# Patient Record
Sex: Female | Born: 1990 | Race: Black or African American | Hispanic: No | Marital: Single | Smoking: Current every day smoker
Health system: Southern US, Community
[De-identification: ages and names within clinical notes are randomized; demographics above are authoritative.]

## PROBLEM LIST (undated history)

## (undated) DIAGNOSIS — G039 Meningitis, unspecified: Secondary | ICD-10-CM

## (undated) DIAGNOSIS — G43909 Migraine, unspecified, not intractable, without status migrainosus: Secondary | ICD-10-CM

## (undated) DIAGNOSIS — D219 Benign neoplasm of connective and other soft tissue, unspecified: Secondary | ICD-10-CM

## (undated) DIAGNOSIS — Z87442 Personal history of urinary calculi: Secondary | ICD-10-CM

## (undated) DIAGNOSIS — D649 Anemia, unspecified: Secondary | ICD-10-CM

## (undated) HISTORY — PX: ROBOTIC ASSISTED LAPAROSCOPIC VAGINAL HYSTERECTOMY WITH FIBROID REMOVAL: SHX5387

## (undated) HISTORY — DX: Migraine, unspecified, not intractable, without status migrainosus: G43.909

## (undated) HISTORY — PX: OTHER SURGICAL HISTORY: SHX169

---
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
## 2010-01-29 ENCOUNTER — Emergency Department (HOSPITAL_COMMUNITY)
Admission: EM | Admit: 2010-01-29 | Discharge: 2010-01-29 | Payer: Self-pay | Source: Home / Self Care | Admitting: Emergency Medicine

## 2010-02-01 LAB — WET PREP, GENITAL
Trich, Wet Prep: NONE SEEN
Yeast Wet Prep HPF POC: NONE SEEN

## 2010-02-01 LAB — DIFFERENTIAL
Basophils Absolute: 0 10*3/uL (ref 0.0–0.1)
Basophils Relative: 0 % (ref 0–1)
Eosinophils Absolute: 0 10*3/uL (ref 0.0–0.7)
Eosinophils Relative: 0 % (ref 0–5)
Lymphocytes Relative: 34 % (ref 12–46)
Lymphs Abs: 2.1 10*3/uL (ref 0.7–4.0)
Monocytes Absolute: 0.4 10*3/uL (ref 0.1–1.0)
Monocytes Relative: 6 % (ref 3–12)
Neutro Abs: 3.7 10*3/uL (ref 1.7–7.7)
Neutrophils Relative %: 60 % (ref 43–77)

## 2010-02-01 LAB — LIPASE, BLOOD: Lipase: 29 U/L (ref 11–59)

## 2010-02-01 LAB — URINE MICROSCOPIC-ADD ON

## 2010-02-01 LAB — URINALYSIS, ROUTINE W REFLEX MICROSCOPIC
Bilirubin Urine: NEGATIVE
Hgb urine dipstick: NEGATIVE
Ketones, ur: NEGATIVE mg/dL
Nitrite: NEGATIVE
Protein, ur: NEGATIVE mg/dL
Specific Gravity, Urine: 1.024 (ref 1.005–1.030)
Urine Glucose, Fasting: NEGATIVE mg/dL
Urobilinogen, UA: 1 mg/dL (ref 0.0–1.0)
pH: 6.5 (ref 5.0–8.0)

## 2010-02-01 LAB — COMPREHENSIVE METABOLIC PANEL
ALT: 17 U/L (ref 0–35)
AST: 17 U/L (ref 0–37)
Albumin: 4.1 g/dL (ref 3.5–5.2)
Alkaline Phosphatase: 48 U/L (ref 39–117)
BUN: 5 mg/dL — ABNORMAL LOW (ref 6–23)
CO2: 24 mEq/L (ref 19–32)
Calcium: 9.2 mg/dL (ref 8.4–10.5)
Chloride: 106 mEq/L (ref 96–112)
Creatinine, Ser: 0.68 mg/dL (ref 0.4–1.2)
GFR calc Af Amer: >60 mL/min (ref >60)
GFR calc non Af Amer: >60 mL/min (ref >60)
Glucose, Bld: 95 mg/dL (ref 70–99)
Potassium: 3.8 mEq/L (ref 3.5–5.1)
Sodium: 139 mEq/L (ref 135–145)
Total Bilirubin: 0.6 mg/dL (ref 0.3–1.2)
Total Protein: 8.5 g/dL — ABNORMAL HIGH (ref 6.0–8.3)

## 2010-02-01 LAB — CBC
HCT: 32.7 % — ABNORMAL LOW (ref 36.0–46.0)
Hemoglobin: 10.2 g/dL — ABNORMAL LOW (ref 12.0–15.0)
MCH: 20.8 pg — ABNORMAL LOW (ref 26.0–34.0)
MCHC: 31.2 g/dL (ref 30.0–36.0)
MCV: 66.6 fL — ABNORMAL LOW (ref 78.0–100.0)
Platelets: 418 10*3/uL — ABNORMAL HIGH (ref 150–400)
RBC: 4.91 MIL/uL (ref 3.87–5.11)
RDW: 18.9 % — ABNORMAL HIGH (ref 11.5–15.5)
WBC: 6.2 10*3/uL (ref 4.0–10.5)

## 2010-02-01 LAB — OCCULT BLOOD, POC DEVICE: Fecal Occult Bld: NEGATIVE

## 2010-02-01 LAB — GC/CHLAMYDIA PROBE AMP, GENITAL
Chlamydia, DNA Probe: NEGATIVE
GC Probe Amp, Genital: NEGATIVE

## 2010-02-01 LAB — POCT PREGNANCY, URINE: Preg Test, Ur: NEGATIVE

## 2010-03-31 LAB — COMPREHENSIVE METABOLIC PANEL
ALT: 19 U/L (ref 0–35)
AST: 21 U/L (ref 0–37)
Albumin: 3.8 g/dL (ref 3.5–5.2)
Alkaline Phosphatase: 45 U/L (ref 39–117)
BUN: 8 mg/dL (ref 6–23)
CO2: 23 mEq/L (ref 19–32)
Calcium: 9.1 mg/dL (ref 8.4–10.5)
Chloride: 107 mEq/L (ref 96–112)
Creatinine, Ser: 0.72 mg/dL (ref 0.4–1.2)
GFR calc Af Amer: >60 mL/min (ref >60)
GFR calc non Af Amer: >60 mL/min (ref >60)
Glucose, Bld: 103 mg/dL — ABNORMAL HIGH (ref 70–99)
Potassium: 3.4 mEq/L — ABNORMAL LOW (ref 3.5–5.1)
Sodium: 138 mEq/L (ref 135–145)
Total Bilirubin: 0.4 mg/dL (ref 0.3–1.2)
Total Protein: 7.4 g/dL (ref 6.0–8.3)

## 2010-03-31 LAB — LIPASE, BLOOD: Lipase: 39 U/L (ref 11–59)

## 2010-03-31 LAB — CBC
HCT: 31.2 % — ABNORMAL LOW (ref 36.0–46.0)
Hemoglobin: 10 g/dL — ABNORMAL LOW (ref 12.0–15.0)
MCH: 21 pg — ABNORMAL LOW (ref 26.0–34.0)
MCHC: 32 g/dL (ref 30.0–36.0)
MCV: 65.7 fL — ABNORMAL LOW (ref 78.0–100.0)
Platelets: 369 10*3/uL (ref 150–400)
RBC: 4.75 MIL/uL (ref 3.87–5.11)
RDW: 20.4 % — ABNORMAL HIGH (ref 11.5–15.5)
WBC: 6.3 10*3/uL (ref 4.0–10.5)

## 2010-03-31 LAB — URINE MICROSCOPIC-ADD ON

## 2010-03-31 LAB — DIFFERENTIAL
Basophils Absolute: 0 10*3/uL (ref 0.0–0.1)
Basophils Relative: 0 % (ref 0–1)
Eosinophils Absolute: 0.1 10*3/uL (ref 0.0–0.7)
Eosinophils Relative: 1 % (ref 0–5)
Lymphocytes Relative: 39 % (ref 12–46)
Lymphs Abs: 2.5 10*3/uL (ref 0.7–4.0)
Monocytes Absolute: 0.5 10*3/uL (ref 0.1–1.0)
Monocytes Relative: 8 % (ref 3–12)
Neutro Abs: 3.2 10*3/uL (ref 1.7–7.7)
Neutrophils Relative %: 52 % (ref 43–77)

## 2010-03-31 LAB — URINALYSIS, ROUTINE W REFLEX MICROSCOPIC
Bilirubin Urine: NEGATIVE
Glucose, UA: NEGATIVE mg/dL
Hgb urine dipstick: NEGATIVE
Ketones, ur: 15 mg/dL — AB
Nitrite: NEGATIVE
Protein, ur: NEGATIVE mg/dL
Specific Gravity, Urine: 1.03 (ref 1.005–1.030)
Urobilinogen, UA: 0.2 mg/dL (ref 0.0–1.0)
pH: 5.5 (ref 5.0–8.0)

## 2010-03-31 LAB — PREGNANCY, URINE: Preg Test, Ur: NEGATIVE

## 2011-03-24 ENCOUNTER — Encounter (HOSPITAL_COMMUNITY): Payer: Self-pay | Admitting: Emergency Medicine

## 2011-03-24 ENCOUNTER — Emergency Department (HOSPITAL_COMMUNITY)
Admission: EM | Admit: 2011-03-24 | Discharge: 2011-03-24 | Disposition: A | Discharge status: Home / Self Care | Payer: Self-pay | Attending: Emergency Medicine | Admitting: Emergency Medicine

## 2011-03-24 DIAGNOSIS — F17200 Nicotine dependence, unspecified, uncomplicated: Secondary | ICD-10-CM | POA: Insufficient documentation

## 2011-03-24 DIAGNOSIS — R509 Fever, unspecified: Secondary | ICD-10-CM | POA: Insufficient documentation

## 2011-03-24 DIAGNOSIS — J029 Acute pharyngitis, unspecified: Secondary | ICD-10-CM | POA: Insufficient documentation

## 2011-03-24 DIAGNOSIS — H9209 Otalgia, unspecified ear: Secondary | ICD-10-CM | POA: Insufficient documentation

## 2011-03-24 LAB — RAPID STREP SCREEN (MED CTR MEBANE ONLY): Streptococcus, Group A Screen (Direct): NEGATIVE

## 2011-03-24 MED ORDER — AMOXICILLIN 500 MG PO CAPS: 500.0000 mg | ORAL_CAPSULE | Freq: Three times a day (TID) | ORAL | Status: DC

## 2011-03-24 MED ORDER — OXYCODONE-ACETAMINOPHEN 5-325 MG PO TABS
1.0000 | ORAL_TABLET | Freq: Once | ORAL | Status: AC
  Administered 2011-03-24: 1 via ORAL
  Filled 2011-03-24: qty 1

## 2011-03-24 MED ORDER — ANTIPYRINE-BENZOCAINE 5.4-1.4 % OT SOLN: 3.0000 [drp] | OTIC | Status: DC | PRN

## 2011-03-24 NOTE — ED Notes (Signed)
Pt refused ear drops.

## 2011-03-24 NOTE — ED Notes (Signed)
Ordered Auralgan from pharmacy.

## 2011-03-24 NOTE — ED Provider Notes (Signed)
History     CSN: [id number]  Arrival date & time 03/24/11  2139   First MD Initiated Contact with Patient 03/24/11 2159      Chief Complaint  Patient presents with  . Sore Throat    (Consider location/radiation/quality/duration/timing/severity/associated sxs/prior treatment) Patient is a 21 y.o. female presenting with pharyngitis. The history is provided by the patient. No language interpreter was used.  Sore Throat This is a new problem. The current episode started in the past 7 days. The problem occurs constantly. The problem has been gradually worsening. Associated symptoms include congestion and a fever. Pertinent negatives include no chills. The symptoms are aggravated by swallowing. She has tried acetaminophen for the symptoms. The treatment provided mild relief.    History reviewed. No pertinent past medical history.  History reviewed. No pertinent past surgical history.  No family history on file.  History  Substance Use Topics  . Smoking status: Current Everyday Smoker  . Smokeless tobacco: Not on file  . Alcohol Use: Yes    OB History    Grav Para Term Preterm Abortions TAB SAB Ect Mult Living                  Review of Systems  Constitutional: Positive for fever. Negative for chills.  HENT: Positive for congestion.   All other systems reviewed and are negative.    Allergies  Vancomycin  Home Medications   Current Outpatient Rx  Name Route Sig Dispense Refill  . ACETAMINOPHEN-GUAIFENESIN 325-200 MG PO TABS Oral Take 1 tablet by mouth every 6 (six) hours as needed. For cold symptoms    . IBUPROFEN 200 MG PO TABS Oral Take 200 mg by mouth every 6 (six) hours as needed. For pain/fever    . PHENOL 1.4 % MT LIQD Mouth/Throat Use as directed 1 spray in the mouth or throat as needed. For sore throat      BP 151/99  Pulse 82  Temp(Src) 98.2 F (36.8 C) (Oral)  Resp 18  SpO2 100%  LMP 03/22/2011  Physical Exam  Nursing note and vitals  reviewed. Constitutional: She is oriented to person, place, and time. She appears well-developed and well-nourished.  HENT:  Head: Normocephalic and atraumatic.  Right Ear: Tympanic membrane is injected and bulging. A middle ear effusion is present.  Mouth/Throat: Posterior oropharyngeal erythema present.  Eyes: Conjunctivae are normal. Pupils are equal, round, and reactive to light.  Neck: Normal range of motion. Neck supple.  Cardiovascular: Normal rate, regular rhythm, normal heart sounds and intact distal pulses.   Pulmonary/Chest: Effort normal and breath sounds normal.  Abdominal: Soft. Bowel sounds are normal.  Musculoskeletal: Normal range of motion.  Lymphadenopathy:    She has no cervical adenopathy.  Neurological: She is alert and oriented to person, place, and time. She has normal reflexes.  Skin: Skin is warm and dry.  Psychiatric: She has a normal mood and affect. Her behavior is normal. Judgment and thought content normal.    ED Course  Procedures (including critical care time)   Labs Reviewed  RAPID STREP SCREEN   No results found.   No diagnosis found.  Strep screen negative.  Otalgia likely due to OME.  MDM          Jimmye Norman, NP 03/24/11 2337

## 2011-03-24 NOTE — Discharge Instructions (Signed)
Otalgia The most common reason for this in children is an infection of the middle ear. Pain from the middle ear is usually caused by a build-up of fluid and pressure behind the eardrum. Pain from an earache can be sharp, dull, or burning. The pain may be temporary or constant. The middle ear is connected to the nasal passages by a short narrow tube called the Eustachian tube. The Eustachian tube allows fluid to drain out of the middle ear, and helps keep the pressure in your ear equalized. CAUSES  A cold or allergy can block the Eustachian tube with inflammation and the build-up of secretions. This is especially likely in small children, because their Eustachian tube is shorter and more horizontal. When the Eustachian tube closes, the normal flow of fluid from the middle ear is stopped. Fluid can accumulate and cause stuffiness, pain, hearing loss, and an ear infection if germs start growing in this area. SYMPTOMS  The symptoms of an ear infection may include fever, ear pain, fussiness, increased crying, and irritability. Many children will have temporary and minor hearing loss during and right after an ear infection. Permanent hearing loss is rare, but the risk increases the more infections a child has. Other causes of ear pain include retained water in the outer ear canal from swimming and bathing. Ear pain in adults is less likely to be from an ear infection. Ear pain may be referred from other locations. Referred pain may be from the joint between your jaw and the skull. It may also come from a tooth problem or problems in the neck. Other causes of ear pain include:  A foreign body in the ear.   Outer ear infection.   Sinus infections.   Impacted ear wax.   Ear injury.   Arthritis of the jaw or TMJ problems.   Middle ear infection.   Tooth infections.   Sore throat with pain to the ears.  DIAGNOSIS  Your caregiver can usually make the diagnosis by examining you. Sometimes other special  studies, including x-rays and lab work may be necessary. TREATMENT   If antibiotics were prescribed, use them as directed and finish them even if you or your child's symptoms seem to be improved.   Sometimes PE tubes are needed in children. These are little plastic tubes which are put into the eardrum during a simple surgical procedure. They allow fluid to drain easier and allow the pressure in the middle ear to equalize. This helps relieve the ear pain caused by pressure changes.  HOME CARE INSTRUCTIONS   Only take over-the-counter or prescription medicines for pain, discomfort, or fever as directed by your caregiver. DO NOT GIVE CHILDREN ASPIRIN because of the association of Reye's Syndrome in children taking aspirin.   Use a cold pack applied to the outer ear for 15 to 20 minutes, 3 to 4 times per day or as needed may reduce pain. Do not apply ice directly to the skin. You may cause frost bite.   Over-the-counter ear drops used as directed may be effective. Your caregiver may sometimes prescribe ear drops.   Resting in an upright position may help reduce pressure in the middle ear and relieve pain.   Ear pain caused by rapidly descending from high altitudes can be relieved by swallowing or chewing gum. Allowing infants to suck on a bottle during airplane travel can help.   Do not smoke in the house or near children. If you are unable to quit smoking, smoke outside.     Control allergies.  SEEK IMMEDIATE MEDICAL CARE IF:   You or your child are becoming sicker.   Pain or fever relief is not obtained with medicine.   You or your child's symptoms (pain, fever, or irritability) do not improve within 24 to 48 hours or as instructed.   Severe pain suddenly stops hurting. This may indicate a ruptured eardrum.   You or your children develop new problems such as severe headaches, stiff neck, difficulty swallowing, or swelling of the face or around the ear.  Document Released: 08/21/2003  Document Revised: 12/23/2010 Document Reviewed: 12/26/2007 [facility] Patient Information 2012 [place], [place].  RESOURCE GUIDE  Dental Problems  Patients with Medicaid: [facility] [phone]                                           [phone] [address] [employer] Phone:  [phone]                                                  Phone:  [phone]  If unable to pay or uninsured, contact:  [facility] or [facility]. to become qualified for the adult dental clinic.  Chronic Pain Problems Contact [facility]  [phone] Patients need to be referred by their primary care doctor.  Insufficient Money for Medicine Contact United Way:  call "211" or [facility] ([phone])404-9750.  No Primary Care Doctor Call [facility]  407-714-6464 Other agencies that provide inexpensive medical care    [facility]  [phone]    [facility]  [phone]    [facility]  [phone]    [facility]  [phone]    [facility]  [phone]    [facility]  [phone]  Psychological Services [facility]  [phone] [facility] Services  [phone] [facility]   [phone] (emergency services [phone])  Substance Abuse Resources [facility]  539-784-8435 [facility] [phone]) [phone] [facility] [phone] Floydene Flock 667-880-8772 Residential & Outpatient Substance Abuse Program  ([phone]  Abuse/Neglect [facility] Child Abuse Hotline 715-692-9726 [facility] Child Abuse Hotline 617-395-4566 (After Hours)  Emergency Shelter [facility] 6295259614  Maternity Homes Room at the [place] [facility] 213-370-5080 [facility] [phone]  MRSA Hotline #:   (418)880-2855    [facility] Resources  [facility]     [employer]                           [facility] Dept. [address] [place]                       [address]      [address]  [address]  Cristobal Goldmann Phone:  [phone]                                   Phone:  [phone]                 Phone:  [phone]  [facility] Mental Health Phone:  [phone]  [facility] Child Abuse Hotline (816)629-1336 [phone])[phone] (After Hours)

## 2011-03-24 NOTE — ED Notes (Signed)
PT. REPORTS SORE THROAT FOR 3 DAYS WITH NASAL CONGESTION , HEADACHE AND RIGHT EAR ACHE. DENIES FEVER OR CHILLS.

## 2011-03-26 NOTE — ED Provider Notes (Signed)
Medical screening examination/treatment/procedure(s) were performed by non-physician practitioner and as supervising physician I was immediately available for consultation/collaboration.  Cyndra Numbers, MD 03/26/11 [phone]

## 2011-03-29 ENCOUNTER — Encounter (HOSPITAL_COMMUNITY): Payer: Self-pay | Admitting: Emergency Medicine

## 2011-03-29 ENCOUNTER — Emergency Department (INDEPENDENT_AMBULATORY_CARE_PROVIDER_SITE_OTHER)
Admission: EM | Admit: 2011-03-29 | Discharge: 2011-03-29 | Disposition: A | Discharge status: Home / Self Care | Payer: Self-pay | Source: Home / Self Care | Attending: Family Medicine | Admitting: Family Medicine

## 2011-03-29 DIAGNOSIS — J029 Acute pharyngitis, unspecified: Secondary | ICD-10-CM

## 2011-03-29 DIAGNOSIS — J329 Chronic sinusitis, unspecified: Secondary | ICD-10-CM

## 2011-03-29 MED ORDER — IBUPROFEN 600 MG PO TABS: 600.0000 mg | ORAL_TABLET | Freq: Three times a day (TID) | ORAL | Status: AC | PRN

## 2011-03-29 MED ORDER — OMEPRAZOLE 20 MG PO CPDR: 20.0000 mg | DELAYED_RELEASE_CAPSULE | Freq: Every day | ORAL | Status: DC

## 2011-03-29 MED ORDER — PENICILLIN V POTASSIUM 500 MG PO TABS: 500.0000 mg | ORAL_TABLET | Freq: Three times a day (TID) | ORAL | Status: AC

## 2011-03-29 MED ORDER — IBUPROFEN 800 MG PO TABS
ORAL_TABLET | ORAL | Status: AC
  Filled 2011-03-29: qty 1

## 2011-03-29 MED ORDER — IBUPROFEN 800 MG PO TABS
800.0000 mg | ORAL_TABLET | Freq: Once | ORAL | Status: AC
  Administered 2011-03-29: 800 mg via ORAL

## 2011-03-29 MED ORDER — FEXOFENADINE-PSEUDOEPHED ER 60-120 MG PO TB12: 1.0000 | ORAL_TABLET | Freq: Two times a day (BID) | ORAL | Status: DC

## 2011-03-29 MED ORDER — PREDNISONE 20 MG PO TABS: ORAL_TABLET | ORAL | Status: AC

## 2011-03-29 NOTE — ED Provider Notes (Signed)
History     CSN: [id number]  Arrival date & time 03/29/11  1510   First MD Initiated Contact with Patient 03/29/11 1613      Chief Complaint  Patient presents with  . Sore Throat    (Consider location/radiation/quality/duration/timing/severity/associated sxs/prior treatment) HPI Comments: 21 year old female with history of obesity otherwise previously healthy. Here complaining of sore throat sinus pressure in right ear ache for 8 days. Has felt chills and subjective fever. Denies cough or chest pain. Reports headache and nausea but denies vomiting. Has been taking acetaminophen for 3 times a day without significant relief. Was seen in the emergency department 5 days ago had a negative rapid strep test that she had been e-prescribed amoxicillin and ibuprofen. Apparently patient never filled her prescriptions and was not aware of them. She is able to eat solids and drink fluids with mild discomfort. Denies shortness of breath or difficulty breathing. Reports epigastric type of discomfort and decreased appetite. No diarrhea or rash.   History reviewed. No pertinent past medical history.  History reviewed. No pertinent past surgical history.  No family history on file.  History  Substance Use Topics  . Smoking status: Current Everyday Smoker  . Smokeless tobacco: Not on file  . Alcohol Use: Yes    OB History    Grav Para Term Preterm Abortions TAB SAB Ect Mult Living                  Review of Systems  Constitutional: Positive for chills and appetite change.  HENT: Positive for congestion, sore throat, rhinorrhea, trouble swallowing and sinus pressure. Negative for facial swelling, neck pain and voice change.   Respiratory: Negative for cough, shortness of breath and wheezing.   Gastrointestinal: Positive for nausea. Negative for vomiting, abdominal pain and diarrhea.  Skin: Negative for rash.  Neurological: Positive for headaches.    Allergies  Vancomycin  Home  Medications   Current Outpatient Rx  Name Route Sig Dispense Refill  . ACETAMINOPHEN 500 MG PO TABS Oral Take 500 mg by mouth every 6 (six) hours as needed.    Marland Kitchen FEXOFENADINE-PSEUDOEPHED ER 60-120 MG PO TB12 Oral Take 1 tablet by mouth every 12 (twelve) hours. 30 tablet 0  . IBUPROFEN 600 MG PO TABS Oral Take 1 tablet (600 mg total) by mouth every 8 (eight) hours as needed for pain or fever. 30 tablet 0  . OMEPRAZOLE 20 MG PO CPDR Oral Take 1 capsule (20 mg total) by mouth daily. 30 capsule 0  . PENICILLIN V POTASSIUM 500 MG PO TABS Oral Take 1 tablet (500 mg total) by mouth 3 (three) times daily. 30 tablet 0  . PREDNISONE 20 MG PO TABS  2 tabs po daily for 5 days and will 10 tablet no    BP 142/100  Pulse 90  Temp(Src) 98.5 F (36.9 C) (Oral)  Resp 20  SpO2 98%  LMP 03/21/2011  Physical Exam  Nursing note and vitals reviewed. Constitutional: She is oriented to person, place, and time. She appears well-developed and well-nourished. No distress.  HENT:  Head: Normocephalic and atraumatic.  Right Ear: External ear normal.  Left Ear: External ear normal.  Mouth/Throat: Oropharynx is clear and moist. No oropharyngeal exudate.       Nasal Congestion with erythema and swelling of nasal turbinates, clear rhinorrhea. Reported sinus tenderness with palpation. motherate pharyngeal erythema no exudates, postnasal drip. No uvula deviation. No trismus. TM's with increased vascular markings and some dullness bilaterally no swelling or bulging  Eyes: Conjunctivae are normal. Pupils are equal, round, and reactive to light.  Neck: Normal range of motion. Neck supple. No thyromegaly present.  Abdominal: Soft. There is no tenderness.  Lymphadenopathy:    She has no cervical adenopathy.  Neurological: She is alert and oriented to person, place, and time.  Skin: No rash noted.    ED Course  Procedures (including critical care time)  Labs Reviewed - No data to display No results found.   1.  Sinusitis   2. Sore throat       MDM  Treated with penicillin and prednisone, ibuprofen and decongestant. Given paper prescription.        Sharin Grave, MD 03/31/11 1234

## 2011-03-29 NOTE — Discharge Instructions (Signed)
  Is very important top keep well hydrated. Take the prescribed medications as instructed. Take ibuprofen scheduled for the next 24-48 hours take with food and plenty of liquids as it can upset your stomach, can take over the counter prilosec while taking ibuprofen. Use nasal saline spray at least 3 times a day. (simply saline is over the counter) Return if worsening or persistent symptoms despite following treatrment.

## 2011-03-29 NOTE — ED Notes (Signed)
Sore throat onset 03/21/2011.  Right ear stuffy since Thursday, intermittent headache, stuffy nose, reports intermittent cough.  Intermittent nausea, no vomiting, no diarrhea. Denies fever.

## 2011-12-24 ENCOUNTER — Emergency Department (HOSPITAL_COMMUNITY)
Admission: EM | Admit: 2011-12-24 | Discharge: 2011-12-24 | Disposition: A | Discharge status: Home / Self Care | Payer: Self-pay | Attending: Emergency Medicine | Admitting: Emergency Medicine

## 2011-12-24 ENCOUNTER — Encounter (HOSPITAL_COMMUNITY): Payer: Self-pay | Admitting: Emergency Medicine

## 2011-12-24 DIAGNOSIS — K011 Impacted teeth: Secondary | ICD-10-CM

## 2011-12-24 DIAGNOSIS — F17200 Nicotine dependence, unspecified, uncomplicated: Secondary | ICD-10-CM | POA: Insufficient documentation

## 2011-12-24 DIAGNOSIS — K047 Periapical abscess without sinus: Secondary | ICD-10-CM | POA: Insufficient documentation

## 2011-12-24 DIAGNOSIS — K006 Disturbances in tooth eruption: Secondary | ICD-10-CM | POA: Insufficient documentation

## 2011-12-24 MED ORDER — OXYCODONE-ACETAMINOPHEN 5-325 MG PO TABS: 1.0000 | ORAL_TABLET | Freq: Four times a day (QID) | ORAL | Status: DC | PRN

## 2011-12-24 MED ORDER — PENICILLIN V POTASSIUM 250 MG PO TABS
500.0000 mg | ORAL_TABLET | Freq: Once | ORAL | Status: AC
  Administered 2011-12-24: 500 mg via ORAL
  Filled 2011-12-24: qty 2

## 2011-12-24 MED ORDER — PENICILLIN V POTASSIUM 500 MG PO TABS: 500.0000 mg | ORAL_TABLET | Freq: Three times a day (TID) | ORAL | Status: DC

## 2011-12-24 MED ORDER — IBUPROFEN 400 MG PO TABS
800.0000 mg | ORAL_TABLET | Freq: Once | ORAL | Status: AC
  Administered 2011-12-24: 800 mg via ORAL
  Filled 2011-12-24: qty 2

## 2011-12-24 NOTE — ED Notes (Signed)
Pt c/o pain in left lower back tooth x's 2 days.  St's has been hurting off and on x's 2 yrs.

## 2011-12-24 NOTE — ED Provider Notes (Signed)
History     CSN: [id number]  Arrival date & time 12/24/11  0006   First MD Initiated Contact with Patient 12/24/11 0040      Chief Complaint  Patient presents with  . Dental Pain   HPI  History provided by the patient. Patient is a 21 year old female with no significant PMH who presents with complaints of worsened left lower molar pain for the past 2 days. Patient reports having problems with all 4 of her wisdom teeth over the past 2 years. She states she has waxing and waning pains from time to time usually manageable at home with over-the-counter pain medications. Over the past 2 days pain has been significantly worse and more persistent. Patient states she has been unable to sleep well. Pain is worse with any chewing or pressure over the area. She denies any other aggravating or alleviating factors. She denies any other associated symptoms. Denies any fever, chills, sweats.     History reviewed. No pertinent past medical history.  History reviewed. No pertinent past surgical history.  No family history on file.  History  Substance Use Topics  . Smoking status: Current Every Day Smoker  . Smokeless tobacco: Not on file  . Alcohol Use: Yes    OB History    Grav Para Term Preterm Abortions TAB SAB Ect Mult Living                  Review of Systems  Constitutional: Negative for fever, chills and diaphoresis.  HENT: Positive for dental problem. Negative for sore throat, drooling and trouble swallowing.   Gastrointestinal: Negative for nausea and vomiting.  All other systems reviewed and are negative.    Allergies  Vancomycin  Home Medications   Current Outpatient Rx  Name  Route  Sig  Dispense  Refill  . ACETAMINOPHEN 500 MG PO TABS   Oral   Take 500 mg by mouth every 6 (six) hours as needed. For pain         . IBUPROFEN 200 MG PO TABS   Oral   Take 200 mg by mouth every 6 (six) hours as needed. For pain           BP 136/84  Pulse 94  Temp 98.3 F  (36.8 C) (Oral)  Resp 16  SpO2 100%  LMP 11/29/2011  Physical Exam  Nursing note and vitals reviewed. Constitutional: She is oriented to person, place, and time. She appears well-developed and well-nourished. No distress.  HENT:  Head: Normocephalic.  Mouth/Throat: Oropharynx is clear and moist.         Patient with partially impacted left lower third molar. There is inflammation and slight friability of the lateral adjacent common tissue. Patient is tender in this area. No significant facial swelling.  Neck: Normal range of motion. Neck supple.  Cardiovascular: Normal rate and regular rhythm.   No murmur heard. Pulmonary/Chest: Effort normal and breath sounds normal. No respiratory distress. She has no wheezes. She has no rales.  Lymphadenopathy:    She has no cervical adenopathy.  Neurological: She is alert and oriented to person, place, and time.  Skin: Skin is warm and dry.  Psychiatric: She has a normal mood and affect. Her behavior is normal.    ED Course  Procedures      1. Impacted third molar tooth   2. Periapical abscess       MDM  Patient seen and evaluated. Patient appears comfortable in no acute distress.  Patient given  prescriptions for pain medications and antibiotic. Patient also given dental and oral surgery referral.      Angus Seller, PA 12/24/11 [phone]

## 2011-12-24 NOTE — ED Notes (Signed)
Pt st's she has had pain in left lower back tooth off and on x's 2 yrs.  St's pain started again 2 days ago and has not stopped

## 2011-12-26 NOTE — ED Provider Notes (Signed)
Medical screening examination/treatment/procedure(s) were performed by non-physician practitioner and as supervising physician I was immediately available for consultation/collaboration.  Donnetta Hutching, MD 12/26/11 1520

## 2014-04-20 ENCOUNTER — Emergency Department (HOSPITAL_COMMUNITY)
Admission: EM | Admit: 2014-04-20 | Discharge: 2014-04-20 | Disposition: A | Discharge status: Home / Self Care | Payer: Self-pay | Attending: Emergency Medicine | Admitting: Emergency Medicine

## 2014-04-20 ENCOUNTER — Encounter (HOSPITAL_COMMUNITY): Payer: Self-pay | Admitting: Emergency Medicine

## 2014-04-20 DIAGNOSIS — N390 Urinary tract infection, site not specified: Secondary | ICD-10-CM | POA: Insufficient documentation

## 2014-04-20 DIAGNOSIS — Z79899 Other long term (current) drug therapy: Secondary | ICD-10-CM | POA: Insufficient documentation

## 2014-04-20 DIAGNOSIS — Z3202 Encounter for pregnancy test, result negative: Secondary | ICD-10-CM | POA: Insufficient documentation

## 2014-04-20 DIAGNOSIS — Z792 Long term (current) use of antibiotics: Secondary | ICD-10-CM | POA: Insufficient documentation

## 2014-04-20 DIAGNOSIS — Z720 Tobacco use: Secondary | ICD-10-CM | POA: Insufficient documentation

## 2014-04-20 LAB — URINE MICROSCOPIC-ADD ON

## 2014-04-20 LAB — WET PREP, GENITAL
CLUE CELLS WET PREP: NONE SEEN
TRICH WET PREP: NONE SEEN
YEAST WET PREP: NONE SEEN

## 2014-04-20 LAB — URINALYSIS, ROUTINE W REFLEX MICROSCOPIC
BILIRUBIN URINE: NEGATIVE
GLUCOSE, UA: NEGATIVE mg/dL
KETONES UR: NEGATIVE mg/dL
Nitrite: NEGATIVE
PH: 5.5 (ref 5.0–8.0)
PROTEIN: 30 mg/dL — AB
SPECIFIC GRAVITY, URINE: 1.022 (ref 1.005–1.030)
UROBILINOGEN UA: 0.2 mg/dL (ref 0.0–1.0)

## 2014-04-20 LAB — PREGNANCY, URINE: PREG TEST UR: NEGATIVE

## 2014-04-20 MED ORDER — CEPHALEXIN 500 MG PO CAPS: 500.0000 mg | ORAL_CAPSULE | Freq: Four times a day (QID) | ORAL | Status: DC

## 2014-04-20 MED ORDER — PHENAZOPYRIDINE HCL 95 MG PO TABS: 95.0000 mg | ORAL_TABLET | Freq: Three times a day (TID) | ORAL | Status: DC | PRN

## 2014-04-20 NOTE — ED Provider Notes (Signed)
CSN: [id number]     Arrival date & time 04/20/14  1205 History  This chart was scribed for a non-physician practitioner, Comer Locket, PA-C working with Evelina Bucy, MD by Martinique Peace, ED Scribe. The patient was seen in [facility]. The patient's care was started at 12:17 PM.     Chief Complaint  Patient presents with  . Urinary Frequency      Patient is a 24 y.o. female presenting with frequency. The history is provided by the patient. No language interpreter was used.  Urinary Frequency    HPI Comments: Angie Boyle is a 24 y.o. female who presents to the Emergency Department complaining of UTI symptoms onset Thursday including lower abdominal pain, increased frequency in urination, urinary retention, and a "tingling sensation" during urination. She states she thinks she has a UTI because she tested positive when using a home UTI test. Rates pain as 4/10 currently, describes pain as cramping and feeling bloated. She adds that she just finished her menstrual cycle on Thursday and notes it was a little more "heavier" than usual. Pt explains that she did engage in unprotected sexual intercourse with a new sexual partner on Thursday, which she states is when all of her symptoms seemed to start. No complaints of fever. Pt has tried taking Tylenol to address pain without relief. No other aggravating or modifying factors  History reviewed. No pertinent past medical history. History reviewed. No pertinent past surgical history. History reviewed. No pertinent family history. History  Substance Use Topics  . Smoking status: Current Every Day Smoker  . Smokeless tobacco: Not on file  . Alcohol Use: Yes   OB History    No data available     Review of Systems  Constitutional: Negative for fever.  Genitourinary: Positive for frequency, decreased urine volume and difficulty urinating.  Musculoskeletal: Negative for back pain.  All other systems reviewed and are negative.     Allergies   Vancomycin  Home Medications   Prior to Admission medications   Medication Sig Start Date End Date Taking? Authorizing Provider  acetaminophen (TYLENOL) 500 MG tablet Take 1,000 mg by mouth every 6 (six) hours as needed for mild pain, moderate pain, fever or headache. For pain   Yes Historical Provider, MD  IRON PO Take 1 tablet by mouth daily.   Yes Historical Provider, MD  cephALEXin (KEFLEX) 500 MG capsule Take 1 capsule (500 mg total) by mouth 4 (four) times daily. 04/20/14   Comer Locket, PA-C  oxyCODONE-acetaminophen (PERCOCET/ROXICET) 5-325 MG per tablet Take 1-2 tablets by mouth every 6 (six) hours as needed for pain. Patient not taking: Reported on 04/20/2014 12/24/11   Hazel Sams, PA-C  penicillin v potassium (VEETID) 500 MG tablet Take 1 tablet (500 mg total) by mouth 3 (three) times daily. Patient not taking: Reported on 04/20/2014 12/24/11   Hazel Sams, PA-C  phenazopyridine (PYRIDIUM) 95 MG tablet Take 1 tablet (95 mg total) by mouth 3 (three) times daily as needed for pain. 04/20/14   Comer Locket, PA-C   BP 129/82 mmHg  Pulse 65  Temp(Src) 98 F (36.7 C) (Oral)  Resp 18  SpO2 97%  LMP 04/13/2014 Physical Exam  Constitutional: She is oriented to person, place, and time. She appears well-developed and well-nourished. No distress.  HENT:  Head: Normocephalic and atraumatic.  Eyes: Conjunctivae and EOM are normal.  Neck: Neck supple. No tracheal deviation present.  Cardiovascular: Normal rate.   Pulmonary/Chest: Effort normal and breath sounds normal. No respiratory distress. She  has no wheezes. She has no rales.  Abdominal: Soft. She exhibits no distension and no mass. There is no tenderness. There is no rebound, no guarding and no CVA tenderness.  Does endorse pressure sensation with palpation of lower abdomen.   Genitourinary:  Chaperone was present for the entire genital exam. No lesions or rashes appreciated on vulva. Cervix visualized on speculum exam and  appropriate cultures sampled. Scant blood in vaginal vault. Discharge: Phone appreciated Upon bi manual exam- No TTP of the adnexa, no cervical motion tenderness. No fullness or masses appreciated. No abnormalities appreciated in structural anatomy.   Musculoskeletal: Normal range of motion.  Neurological: She is alert and oriented to person, place, and time.  Skin: Skin is warm and dry.  Psychiatric: She has a normal mood and affect. Her behavior is normal.  Nursing note and vitals reviewed.   ED Course  Procedures (including critical care time) Labs Review Labs Reviewed  WET PREP, GENITAL - Abnormal; Notable for the following:    WBC, Wet Prep HPF POC RARE (*)    All other components within normal limits  URINALYSIS, ROUTINE W REFLEX MICROSCOPIC - Abnormal; Notable for the following:    APPearance CLOUDY (*)    Hgb urine dipstick LARGE (*)    Protein, ur 30 (*)    Leukocytes, UA MODERATE (*)    All other components within normal limits  URINE MICROSCOPIC-ADD ON - Abnormal; Notable for the following:    Bacteria, UA MANY (*)    All other components within normal limits  PREGNANCY, URINE  HIV ANTIBODY (ROUTINE TESTING)  GC/CHLAMYDIA PROBE AMP ()    Imaging Review No results found.   EKG Interpretation None     Medications - No data to display  12:24 PM- Treatment plan was discussed with patient who verbalizes understanding and agrees.  Meds given in ED:  Medications - No data to display  Discharge Medication List as of 04/20/2014  1:34 PM    START taking these medications   Details  cephALEXin (KEFLEX) 500 MG capsule Take 1 capsule (500 mg total) by mouth 4 (four) times daily., Starting 04/20/2014, Until Discontinued, Print    phenazopyridine (PYRIDIUM) 95 MG tablet Take 1 tablet (95 mg total) by mouth 3 (three) times daily as needed for pain., Starting 04/20/2014, Until Discontinued, Print       Filed Vitals:   04/20/14 1209 04/20/14 1415  BP: 129/82  125/69  Pulse: 65 60  Temp: 98 F (36.7 C)   TempSrc: Oral   Resp: 18 17  SpO2: 97% 100%    MDM  Vitals stable - WNL -afebrile Pt resting comfortably in ED. PE--grossly benign abdominal and pelvic exam. Physical exam otherwise unremarkable. Labwork-evidence of UTI on urinalysis. Will treat empirically. Rare white cells on wet prep. Pregnancy negative  DDX--Will treat empirically for UTI. Patient will wait on results to be called to her for GC chlamydia. No evidence of other acute or emergent pathology at this time. Doubt PID, ectopic or torsion.  I discussed all relevant lab findings and imaging results with pt and they verbalized understanding. Discussed f/u with PCP within 48 hrs and return precautions, pt very amenable to plan. Patient stable, in good condition and is appropriate for discharge  Final diagnoses:  UTI (lower urinary tract infection)   I personally performed the services described in this documentation, which was scribed in my presence. The recorded information has been reviewed and is accurate.    Comer Locket, PA-C  04/20/14 [place], MD 04/22/14 2351

## 2014-04-20 NOTE — ED Notes (Signed)
Lower abd pain, frequent urination, urinary retention, and tingling sensation during urination. States "I think I have a UTI because a test I took said I did."

## 2014-04-20 NOTE — Discharge Instructions (Signed)
Sexually Transmitted Disease A sexually transmitted disease (STD) is a disease or infection that may be passed (transmitted) from person to person, usually during sexual activity. This may happen by way of saliva, semen, blood, vaginal mucus, or urine. Common STDs include:   Gonorrhea.   Chlamydia.   Syphilis.   HIV and AIDS.   Genital herpes.   Hepatitis B and C.   Trichomonas.   Human papillomavirus (HPV).   Pubic lice.   Scabies.  Mites.  Bacterial vaginosis. WHAT ARE CAUSES OF STDs? An STD may be caused by bacteria, a virus, or parasites. STDs are often transmitted during sexual activity if one person is infected. However, they may also be transmitted through nonsexual means. STDs may be transmitted after:   Sexual intercourse with an infected person.   Sharing sex toys with an infected person.   Sharing needles with an infected person or using unclean piercing or tattoo needles.  Having intimate contact with the genitals, mouth, or rectal areas of an infected person.   Exposure to infected fluids during birth. WHAT ARE THE SIGNS AND SYMPTOMS OF STDs? Different STDs have different symptoms. Some people may not have any symptoms. If symptoms are present, they may include:   Painful or bloody urination.   Pain in the pelvis, abdomen, vagina, anus, throat, or eyes.   A skin rash, itching, or irritation.  Growths, ulcerations, blisters, or sores in the genital and anal areas.  Abnormal vaginal discharge with or without bad odor.   Penile discharge in men.   Fever.   Pain or bleeding during sexual intercourse.   Swollen glands in the groin area.   Yellow skin and eyes (jaundice). This is seen with hepatitis.   Swollen testicles.  Infertility.  Sores and blisters in the mouth. HOW ARE STDs DIAGNOSED? To make a diagnosis, your health care provider may:   Take a medical history.   Perform a physical exam.   Take a sample of  any discharge to examine.  Swab the throat, cervix, opening to the penis, rectum, or vagina for testing.  Test a sample of your first morning urine.   Perform blood tests.   Perform a Pap test, if this applies.   Perform a colposcopy.   Perform a laparoscopy.  HOW ARE STDs TREATED? Treatment depends on the STD. Some STDs may be treated but not cured.   Chlamydia, gonorrhea, trichomonas, and syphilis can be cured with antibiotic medicine.   Genital herpes, hepatitis, and HIV can be treated, but not cured, with prescribed medicines. The medicines lessen symptoms.   Genital warts from HPV can be treated with medicine or by freezing, burning (electrocautery), or surgery. Warts may come back.   HPV cannot be cured with medicine or surgery. However, abnormal areas may be removed from the cervix, vagina, or vulva.   If your diagnosis is confirmed, your recent sexual partners need treatment. This is true even if they are symptom-free or have a negative culture or evaluation. They should not have sex until their health care providers say it is okay. HOW CAN I REDUCE MY RISK OF GETTING AN STD? Take these steps to reduce your risk of getting an STD:  Use latex condoms, dental dams, and water-soluble lubricants during sexual activity. Do not use petroleum jelly or oils.  Avoid having multiple sex partners.  Do not have sex with someone who has other sex partners.  Do not have sex with anyone you do not know or who is at   high risk for an STD.  Avoid risky sex practices that can break your skin.  Do not have sex if you have open sores on your mouth or skin.  Avoid drinking too much alcohol or taking illegal drugs. Alcohol and drugs can affect your judgment and put you in a vulnerable position.  Avoid engaging in oral and anal sex acts.  Get vaccinated for HPV and hepatitis. If you have not received these vaccines in the past, talk to your health care provider about whether one  or both might be right for you.   If you are at risk of being infected with HIV, it is recommended that you take a prescription medicine daily to prevent HIV infection. This is called pre-exposure prophylaxis (PrEP). You are considered at risk if:  You are a man who has sex with other men (MSM).  You are a heterosexual man or woman and are sexually active with more than one partner.  You take drugs by injection.  You are sexually active with a partner who has HIV.  Talk with your health care provider about whether you are at high risk of being infected with HIV. If you choose to begin PrEP, you should first be tested for HIV. You should then be tested every 3 months for as long as you are taking PrEP.  WHAT SHOULD I DO IF I THINK I HAVE AN STD?  See your health care provider.   Tell your sexual partner(s). They should be tested and treated for any STDs.  Do not have sex until your health care provider says it is okay. WHEN SHOULD I GET IMMEDIATE MEDICAL CARE? Contact your health care provider right away if:   You have severe abdominal pain.  You are a man and notice swelling or pain in your testicles.  You are a woman and notice swelling or pain in your vagina. Document Released: 03/26/2002 Document Revised: 01/08/2013 Document Reviewed: 07/24/2012 [facility] Patient Information 2015 [place], [place]. This information is not intended to replace advice given to you by your health care provider. Make sure you discuss any questions you have with your health care provider.  Urinary Tract Infection Urinary tract infections (UTIs) can develop anywhere along your urinary tract. Your urinary tract is your body's drainage system for removing wastes and extra water. Your urinary tract includes two kidneys, two ureters, a bladder, and a urethra. Your kidneys are a pair of bean-shaped organs. Each kidney is about the size of your fist. They are located below your ribs, one on each side of your  spine. CAUSES Infections are caused by microbes, which are microscopic organisms, including fungi, viruses, and bacteria. These organisms are so small that they can only be seen through a microscope. Bacteria are the microbes that most commonly cause UTIs. SYMPTOMS  Symptoms of UTIs may vary by age and gender of the patient and by the location of the infection. Symptoms in young women typically include a frequent and intense urge to urinate and a painful, burning feeling in the bladder or urethra during urination. Older women and men are more likely to be tired, shaky, and weak and have muscle aches and abdominal pain. A fever may mean the infection is in your kidneys. Other symptoms of a kidney infection include pain in your back or sides below the ribs, nausea, and vomiting. DIAGNOSIS To diagnose a UTI, your caregiver will ask you about your symptoms. Your caregiver also will ask to provide a urine sample. The urine sample  will be tested for bacteria and white blood cells. White blood cells are made by your body to help fight infection. TREATMENT  Typically, UTIs can be treated with medication. Because most UTIs are caused by a bacterial infection, they usually can be treated with the use of antibiotics. The choice of antibiotic and length of treatment depend on your symptoms and the type of bacteria causing your infection. HOME CARE INSTRUCTIONS  If you were prescribed antibiotics, take them exactly as your caregiver instructs you. Finish the medication even if you feel better after you have only taken some of the medication.  Drink enough water and fluids to keep your urine clear or pale yellow.  Avoid caffeine, tea, and carbonated beverages. They tend to irritate your bladder.  Empty your bladder often. Avoid holding urine for long periods of time.  Empty your bladder before and after sexual intercourse.  After a bowel movement, women should cleanse from front to back. Use each tissue only  once. SEEK MEDICAL CARE IF:   You have back pain.  You develop a fever.  Your symptoms do not begin to resolve within 3 days. SEEK IMMEDIATE MEDICAL CARE IF:   You have severe back pain or lower abdominal pain.  You develop chills.  You have nausea or vomiting.  You have continued burning or discomfort with urination. MAKE SURE YOU:   Understand these instructions.  Will watch your condition.  Will get help right away if you are not doing well or get worse. Document Released: 10/13/2004 Document Revised: 07/05/2011 Document Reviewed: 02/11/2011 [facility] Patient Information 2015 [place], [place]. This information is not intended to replace advice given to you by your health care provider. Make sure you discuss any questions you have with your health care provider.  Please take your antibiotics as directed. You may follow-up with her primary care for further evaluation and management of your symptoms. Return to ED for new or worsening symptoms.

## 2014-04-21 LAB — GC/CHLAMYDIA PROBE AMP (~~LOC~~) NOT AT ARMC
Chlamydia: NEGATIVE
Neisseria Gonorrhea: NEGATIVE

## 2014-04-21 LAB — HIV ANTIBODY (ROUTINE TESTING W REFLEX): HIV Screen 4th Generation wRfx: NONREACTIVE

## 2015-10-22 ENCOUNTER — Encounter (HOSPITAL_COMMUNITY): Payer: Self-pay

## 2015-10-22 ENCOUNTER — Emergency Department (HOSPITAL_COMMUNITY)
Admission: EM | Admit: 2015-10-22 | Discharge: 2015-10-22 | Disposition: A | Discharge status: Home / Self Care | Payer: Self-pay | Attending: Emergency Medicine | Admitting: Emergency Medicine

## 2015-10-22 DIAGNOSIS — F17200 Nicotine dependence, unspecified, uncomplicated: Secondary | ICD-10-CM | POA: Insufficient documentation

## 2015-10-22 DIAGNOSIS — B309 Viral conjunctivitis, unspecified: Secondary | ICD-10-CM | POA: Insufficient documentation

## 2015-10-22 DIAGNOSIS — Z79899 Other long term (current) drug therapy: Secondary | ICD-10-CM | POA: Insufficient documentation

## 2015-10-22 MED ORDER — GATIFLOXACIN 0.5 % OP SOLN
1.0000 [drp] | Freq: Four times a day (QID) | OPHTHALMIC | Status: DC
  Administered 2015-10-22: 1 [drp] via OPHTHALMIC
  Filled 2015-10-22: qty 2.5

## 2015-10-22 NOTE — ED Notes (Signed)
Pt reports redness to left eye that began on 10/20/15. Pt denies any injury or exposure to illness.

## 2015-10-22 NOTE — ED Provider Notes (Signed)
[facility] DEPT Provider Note   CSN: [id number] Arrival date & time: 10/22/15  2240     History   Chief Complaint Chief Complaint  Patient presents with  . Conjunctivitis    HPI Angie Boyle is a 25 y.o. female.  25 year old female presents with 24-hour history of watery drainage from her left eye. Denies any purulent discharge. A visual changes. Fever or chills. A severe headache. Denies any sick exposures but feels as if she may be developing a URI. No treatment use prior to arrival. Nothing makes her symptoms better. Denies any photophobia.      History reviewed. No pertinent past medical history.  There are no active problems to display for this patient.   History reviewed. No pertinent surgical history.  OB History    No data available       Home Medications    Prior to Admission medications   Medication Sig Start Date End Date Taking? Authorizing Provider  acetaminophen (TYLENOL) 500 MG tablet Take 1,000 mg by mouth every 6 (six) hours as needed for mild pain, moderate pain, fever or headache. For pain    Historical Provider, MD  cephALEXin (KEFLEX) 500 MG capsule Take 1 capsule (500 mg total) by mouth 4 (four) times daily. 04/20/14   Comer Locket, PA-C  IRON PO Take 1 tablet by mouth daily.    Historical Provider, MD  oxyCODONE-acetaminophen (PERCOCET/ROXICET) 5-325 MG per tablet Take 1-2 tablets by mouth every 6 (six) hours as needed for pain. Patient not taking: Reported on 04/20/2014 12/24/11   Hazel Sams, PA-C  penicillin v potassium (VEETID) 500 MG tablet Take 1 tablet (500 mg total) by mouth 3 (three) times daily. Patient not taking: Reported on 04/20/2014 12/24/11   Hazel Sams, PA-C  phenazopyridine (PYRIDIUM) 95 MG tablet Take 1 tablet (95 mg total) by mouth 3 (three) times daily as needed for pain. 04/20/14   Comer Locket, PA-C    Family History History reviewed. No pertinent family history.  Social History Social History  Substance Use  Topics  . Smoking status: Current Every Day Smoker  . Smokeless tobacco: Never Used  . Alcohol use Yes     Allergies   Vancomycin   Review of Systems Review of Systems  All other systems reviewed and are negative.    Physical Exam Updated Vital Signs BP 120/70 (BP Location: Left Arm)   Pulse 70   Temp 98.3 F (36.8 C) (Oral)   Resp 20   LMP 10/22/2015   SpO2 100%   Physical Exam  Constitutional: She is oriented to person, place, and time. She appears well-developed and well-nourished.  Non-toxic appearance.  HENT:  Head: Normocephalic and atraumatic.  Eyes: Lids are normal. Pupils are equal, round, and reactive to light. Left conjunctiva is injected.  Neck: Normal range of motion.  Cardiovascular: Normal rate.   Pulmonary/Chest: Effort normal.  Neurological: She is alert and oriented to person, place, and time.  Skin: Skin is warm and dry.  Psychiatric: She has a normal mood and affect.  Nursing note and vitals reviewed.    ED Treatments / Results  Labs (all labs ordered are listed, but only abnormal results are displayed) Labs Reviewed - No data to display  EKG  EKG Interpretation None       Radiology No results found.  Procedures Procedures (including critical care time)  Medications Ordered in ED Medications  gatifloxacin (ZYMAXID) 0.5 % ophthalmic drops 1 drop (not administered)     Initial Impression /  Assessment and Plan / ED Course  I have reviewed the triage vital signs and the nursing notes.  Pertinent labs & imaging results that were available during my care of the patient were reviewed by me and considered in my medical decision making (see chart for details).  Clinical Course    Patient with conjunctivitis. Given 1 drop of Zymaxid here as well as return precautions and bottle to go home with  Final Clinical Impressions(s) / ED Diagnoses   Final diagnoses:  None    New Prescriptions New Prescriptions   No medications on  file     Lacretia Leigh, MD 10/22/15 2316

## 2015-10-22 NOTE — Discharge Instructions (Signed)
Use 1 drop every 6 hours to your left eye for the next 3-5 days or until the redness is gone

## 2015-10-22 NOTE — ED Triage Notes (Signed)
Pt complains of a red watery eye since yesterday

## 2015-11-04 ENCOUNTER — Emergency Department (HOSPITAL_COMMUNITY): Payer: Self-pay

## 2015-11-04 ENCOUNTER — Encounter (HOSPITAL_COMMUNITY): Payer: Self-pay | Admitting: *Deleted

## 2015-11-04 ENCOUNTER — Emergency Department (HOSPITAL_COMMUNITY)
Admission: EM | Admit: 2015-11-04 | Discharge: 2015-11-04 | Disposition: A | Discharge status: Home / Self Care | Payer: Self-pay | Attending: Emergency Medicine | Admitting: Emergency Medicine

## 2015-11-04 DIAGNOSIS — R102 Pelvic and perineal pain: Secondary | ICD-10-CM

## 2015-11-04 DIAGNOSIS — N760 Acute vaginitis: Secondary | ICD-10-CM | POA: Insufficient documentation

## 2015-11-04 DIAGNOSIS — R51 Headache: Secondary | ICD-10-CM

## 2015-11-04 DIAGNOSIS — B349 Viral infection, unspecified: Secondary | ICD-10-CM | POA: Insufficient documentation

## 2015-11-04 DIAGNOSIS — R519 Headache, unspecified: Secondary | ICD-10-CM

## 2015-11-04 DIAGNOSIS — D250 Submucous leiomyoma of uterus: Secondary | ICD-10-CM | POA: Insufficient documentation

## 2015-11-04 DIAGNOSIS — Z79899 Other long term (current) drug therapy: Secondary | ICD-10-CM | POA: Insufficient documentation

## 2015-11-04 DIAGNOSIS — F17200 Nicotine dependence, unspecified, uncomplicated: Secondary | ICD-10-CM | POA: Insufficient documentation

## 2015-11-04 DIAGNOSIS — B9689 Other specified bacterial agents as the cause of diseases classified elsewhere: Secondary | ICD-10-CM

## 2015-11-04 LAB — CBC WITH DIFFERENTIAL/PLATELET
Basophils Absolute: 0 10*3/uL (ref 0.0–0.1)
Basophils Relative: 0 %
Eosinophils Absolute: 0 10*3/uL (ref 0.0–0.7)
Eosinophils Relative: 0 %
HCT: 29 % — ABNORMAL LOW (ref 36.0–46.0)
Hemoglobin: 8.6 g/dL — ABNORMAL LOW (ref 12.0–15.0)
Lymphocytes Relative: 9 %
Lymphs Abs: 1.3 10*3/uL (ref 0.7–4.0)
MCH: 18.1 pg — ABNORMAL LOW (ref 26.0–34.0)
MCHC: 29.7 g/dL — ABNORMAL LOW (ref 30.0–36.0)
MCV: 61.1 fL — ABNORMAL LOW (ref 78.0–100.0)
Monocytes Absolute: 0.4 10*3/uL (ref 0.1–1.0)
Monocytes Relative: 3 %
Neutro Abs: 12.8 10*3/uL — ABNORMAL HIGH (ref 1.7–7.7)
Neutrophils Relative %: 88 %
Platelets: 404 10*3/uL — ABNORMAL HIGH (ref 150–400)
RBC: 4.75 MIL/uL (ref 3.87–5.11)
RDW: 19 % — ABNORMAL HIGH (ref 11.5–15.5)
WBC: 14.5 10*3/uL — ABNORMAL HIGH (ref 4.0–10.5)

## 2015-11-04 LAB — BASIC METABOLIC PANEL
Anion gap: 9 (ref 5–15)
BUN: 6 mg/dL (ref 6–20)
CO2: 22 mmol/L (ref 22–32)
Calcium: 8.7 mg/dL — ABNORMAL LOW (ref 8.9–10.3)
Chloride: 103 mmol/L (ref 101–111)
Creatinine, Ser: 0.64 mg/dL (ref 0.44–1.00)
GFR calc Af Amer: >60 mL/min (ref >60)
GFR calc non Af Amer: >60 mL/min (ref >60)
Glucose, Bld: 104 mg/dL — ABNORMAL HIGH (ref 65–99)
Potassium: 3.5 mmol/L (ref 3.5–5.1)
Sodium: 134 mmol/L — ABNORMAL LOW (ref 135–145)

## 2015-11-04 LAB — URINALYSIS, ROUTINE W REFLEX MICROSCOPIC
Bilirubin Urine: NEGATIVE
Glucose, UA: NEGATIVE mg/dL
Hgb urine dipstick: NEGATIVE
Ketones, ur: NEGATIVE mg/dL
Leukocytes, UA: NEGATIVE
Nitrite: NEGATIVE
Protein, ur: NEGATIVE mg/dL
Specific Gravity, Urine: 1.026 (ref 1.005–1.030)
pH: 6 (ref 5.0–8.0)

## 2015-11-04 LAB — POC URINE PREG, ED
Preg Test, Ur: NEGATIVE
Preg Test, Ur: NEGATIVE

## 2015-11-04 LAB — WET PREP, GENITAL
Sperm: NONE SEEN
Trich, Wet Prep: NONE SEEN
Yeast Wet Prep HPF POC: NONE SEEN

## 2015-11-04 LAB — RAPID STREP SCREEN (MED CTR MEBANE ONLY): Streptococcus, Group A Screen (Direct): NEGATIVE

## 2015-11-04 MED ORDER — KETOROLAC TROMETHAMINE 30 MG/ML IJ SOLN
30.0000 mg | Freq: Once | INTRAMUSCULAR | Status: AC
  Administered 2015-11-04: 30 mg via INTRAVENOUS
  Filled 2015-11-04: qty 1

## 2015-11-04 MED ORDER — METRONIDAZOLE 500 MG PO TABS: 500.0000 mg | ORAL_TABLET | Freq: Two times a day (BID) | ORAL | 0 refills | Status: DC

## 2015-11-04 MED ORDER — DIPHENHYDRAMINE HCL 50 MG/ML IJ SOLN
25.0000 mg | Freq: Once | INTRAMUSCULAR | Status: AC
  Administered 2015-11-04: 25 mg via INTRAVENOUS
  Filled 2015-11-04: qty 1

## 2015-11-04 MED ORDER — ONDANSETRON HCL 4 MG PO TABS: 4.0000 mg | ORAL_TABLET | Freq: Four times a day (QID) | ORAL | 0 refills | Status: DC

## 2015-11-04 MED ORDER — ACETAMINOPHEN 325 MG PO TABS
650.0000 mg | ORAL_TABLET | Freq: Once | ORAL | Status: AC
  Administered 2015-11-04: 650 mg via ORAL
  Filled 2015-11-04: qty 2

## 2015-11-04 MED ORDER — PROCHLORPERAZINE EDISYLATE 5 MG/ML IJ SOLN
5.0000 mg | Freq: Once | INTRAMUSCULAR | Status: AC
  Administered 2015-11-04: 5 mg via INTRAVENOUS
  Filled 2015-11-04: qty 2

## 2015-11-04 MED ORDER — SODIUM CHLORIDE 0.9 % IV BOLUS (SEPSIS)
1000.0000 mL | Freq: Once | INTRAVENOUS | Status: AC
  Administered 2015-11-04: 1000 mL via INTRAVENOUS

## 2015-11-04 NOTE — ED Notes (Signed)
Ultrasound at bedside

## 2015-11-04 NOTE — ED Triage Notes (Addendum)
Patient c/o sore throat, body aches, ear ache and nasal congestion since yesterday.  Patient has obvious nasal congestion in triage.  Her oropharnyx is mildly erythematous.  Patient states she has vomited x3 today. Patient also c/o body aches/pains. Patient denies fever at home, but has a temp of 101.8 in triage.  Tylenol given.  Patient was seen here last week for viral conjunctivitis of left eye.

## 2015-11-04 NOTE — ED Notes (Signed)
Discharge instructions, follow up care, and rx x2 reviewed with patient. Patient verbalized understanding. 

## 2015-11-04 NOTE — ED Provider Notes (Signed)
[facility] DEPT Provider Note   CSN: [id number] Arrival date & time: 11/04/15  1104     History   Chief Complaint Chief Complaint  Patient presents with  . Sore Throat  . Generalized Body Aches    HPI Angie Boyle is a 25 y.o. female who presents with a one-day history of generalized body aches, sore throat, ear pain, nasal congestion, nausea, lower abdominal cramping. Patient reports that she had 3 episodes of yellow with blood-tinged vomitus earlier today. Patient denies nausea at this time. Patient reports lower back pain in addition. Patient denies any chest pain, cough, diarrhea. Patient reports shortness of breath with talking to me. Patient is taking DayQuil, over-the-counter sinus relief medication, and ibuprofen over the past 2 days without relief. Patient denies any pelvic pain or any abnormal vaginal discharge or bleeding.  HPI  History reviewed. No pertinent past medical history.  There are no active problems to display for this patient.   History reviewed. No pertinent surgical history.  OB History    No data available       Home Medications    Prior to Admission medications   Medication Sig Start Date End Date Taking? Authorizing Provider  ibuprofen (ADVIL,MOTRIN) 200 MG tablet Take 800 mg by mouth every 6 (six) hours as needed for moderate pain.   Yes Historical Provider, MD  Pheniramine-PE-APAP [facility] COLD & SORE THROAT) 20-10-325 MG PACK Take 1 packet by mouth daily as needed (cold symptoms).   Yes Historical Provider, MD  Pseudoephedrine-APAP-DM (DAYQUIL PO) Take 1 Dose by mouth 3 (three) times daily as needed (cold symptoms).   Yes Historical Provider, MD  metroNIDAZOLE (FLAGYL) 500 MG tablet Take 1 tablet (500 mg total) by mouth 2 (two) times daily. 11/04/15   Consuelo Suthers M Curt Oatis, PA-C  ondansetron (ZOFRAN) 4 MG tablet Take 1 tablet (4 mg total) by mouth every 6 (six) hours. 11/04/15   Frederica Kuster, PA-C    Family History No family history on  file.  Social History Social History  Substance Use Topics  . Smoking status: Current Every Day Smoker  . Smokeless tobacco: Never Used  . Alcohol use Yes     Allergies   Vancomycin   Review of Systems Review of Systems  Constitutional: Positive for appetite change, chills, fatigue and fever.  HENT: Positive for congestion and ear pain. Negative for facial swelling and sore throat.   Respiratory: Positive for shortness of breath.   Cardiovascular: Negative for chest pain.  Gastrointestinal: Positive for abdominal pain (lower abdominal cramping), nausea and vomiting. Negative for diarrhea.  Genitourinary: Negative for dysuria.  Musculoskeletal: Negative for back pain.  Skin: Negative for rash and wound.  Neurological: Negative for headaches.  Psychiatric/Behavioral: The patient is not nervous/anxious.      Physical Exam Updated Vital Signs BP 124/59 (BP Location: Left Arm)   Pulse 97   Temp 98.1 F (36.7 C) (Oral)   Resp 18   Ht 5\' 4"  (1.626 m)   Wt 108.9 kg   LMP 10/19/2015   SpO2 97%   BMI 41.20 kg/m   Physical Exam  Constitutional: She appears well-developed and well-nourished. No distress.  HENT:  Head: Normocephalic and atraumatic.  Right Ear: Tympanic membrane normal.  Left Ear: Tympanic membrane normal.  Mouth/Throat: Oropharynx is clear and moist. No oropharyngeal exudate.  Eyes: Conjunctivae are normal. Pupils are equal, round, and reactive to light. Right eye exhibits no discharge. Left eye exhibits no discharge. No scleral icterus.  Neck: Normal range  of motion. Neck supple. No thyromegaly present.  Tender along the anterior cervical chain, however no lymph nodes palpated  Cardiovascular: Normal rate, regular rhythm, normal heart sounds and intact distal pulses.  Exam reveals no gallop and no friction rub.   No murmur heard. Pulmonary/Chest: Effort normal. No stridor. No respiratory distress. She has no wheezes. She has rhonchi (upper lung feels,  could be upper airway noise). She has no rales.  Abdominal: Soft. Bowel sounds are normal. She exhibits no distension. There is tenderness in the suprapubic area. There is no rebound, no guarding, no CVA tenderness and no tenderness at McBurney's point.    Genitourinary: Pelvic exam was performed with patient supine. Uterus is tender. Cervix exhibits motion tenderness. Right adnexum displays tenderness. Right adnexum displays no mass and no fullness. Left adnexum displays no mass, no tenderness and no fullness. No tenderness or bleeding in the vagina. Vaginal discharge (white, possibly phsyiologic) found.  Musculoskeletal: She exhibits no edema.       Back:  Lymphadenopathy:    She has no cervical adenopathy.  Neurological: She is alert. Coordination normal.  CN 3-12 intact; normal sensation throughout; 5/5 strength in all 4 extremities; equal bilateral grip strength; no ataxia on finger to nose   Skin: Skin is warm and dry. No rash noted. She is not diaphoretic. No pallor.  Psychiatric: She has a normal mood and affect.  Nursing note and vitals reviewed.    ED Treatments / Results  Labs (all labs ordered are listed, but only abnormal results are displayed) Labs Reviewed  WET PREP, GENITAL - Abnormal; Notable for the following:       Result Value   Clue Cells Wet Prep HPF POC PRESENT (*)    WBC, Wet Prep HPF POC MODERATE (*)    All other components within normal limits  URINALYSIS, ROUTINE W REFLEX MICROSCOPIC (NOT AT [facility]) - Abnormal; Notable for the following:    APPearance CLOUDY (*)    All other components within normal limits  BASIC METABOLIC PANEL - Abnormal; Notable for the following:    Sodium 134 (*)    Glucose, Bld 104 (*)    Calcium 8.7 (*)    All other components within normal limits  CBC WITH DIFFERENTIAL/PLATELET - Abnormal; Notable for the following:    WBC 14.5 (*)    Hemoglobin 8.6 (*)    HCT 29.0 (*)    MCV 61.1 (*)    MCH 18.1 (*)    MCHC 29.7 (*)    RDW  19.0 (*)    Platelets 404 (*)    Neutro Abs 12.8 (*)    All other components within normal limits  RAPID STREP SCREEN (NOT AT [facility])  CULTURE, GROUP A STREP ([place])  RPR  HIV ANTIBODY (ROUTINE TESTING)  POC URINE PREG, ED  POC URINE PREG, ED  GC/CHLAMYDIA PROBE AMP ([place]) NOT AT [facility]    EKG  EKG Interpretation None       Radiology Dg Chest 2 View  Result Date: 11/04/2015 CLINICAL DATA:  Cough and congestion for 1 day EXAM: CHEST  2 VIEW COMPARISON:  None. FINDINGS: Lungs are clear. Heart size and pulmonary vascularity are normal. No adenopathy. No pneumothorax. No bone lesions. IMPRESSION: No edema or consolidation. Electronically Signed   By: Lowella Grip III M.D.   On: 11/04/2015 13:30   US Transvaginal Non-ob  Result Date: 11/04/2015 CLINICAL DATA:  Right adnexal tenderness EXAM: TRANSABDOMINAL AND TRANSVAGINAL ULTRASOUND OF PELVIS DOPPLER ULTRASOUND OF OVARIES  TECHNIQUE: Both transabdominal and transvaginal ultrasound examinations of the pelvis were performed. Transabdominal technique was performed for global imaging of the pelvis including uterus, ovaries, adnexal regions, and pelvic cul-de-sac. It was necessary to proceed with endovaginal exam following the transabdominal exam to visualize the uterus, endometrium and both ovaries. Color and duplex Doppler ultrasound was utilized to evaluate blood flow to the ovaries. COMPARISON:  11/12/2009 CT FINDINGS: Uterus Measurements: 13.8 x 7 x 6.6 cm. Uterus is anteverted. Right submucosal anterior uterine body 3 x 2.3 x 2.8 cm fibroid with anterior submucosal fundal to slightly left-sided 5.9 x 5.5 x 4.7 cm submucosal fibroid. Endometrium Thickness: 11 mm.  No focal abnormality visualized. Right ovary Measurements: 3.4 x 1.9 x 2.5 cm. Normal appearance/no adnexal mass. Left ovary Measurements: 3.6 x 2.8 x 2.4 cm. Normal appearance/no adnexal mass. Pulsed Doppler evaluation of both ovaries demonstrates normal low-resistance  arterial and venous waveforms. Other findings Trace free fluid. IMPRESSION: Fibroid uterus with 2 submucosal fibroids identified larger which is fundal measuring 5.9 x 5.5 x 4.7 cm and the second which is right-sided and anterior measuring 3 x 2.3 x 2.8 cm. No ovarian torsion. Electronically Signed   By: Ashley Royalty M.D.   On: 11/04/2015 16:47   US Pelvis Complete  Result Date: 11/04/2015 CLINICAL DATA:  Right adnexal tenderness EXAM: TRANSABDOMINAL AND TRANSVAGINAL ULTRASOUND OF PELVIS DOPPLER ULTRASOUND OF OVARIES TECHNIQUE: Both transabdominal and transvaginal ultrasound examinations of the pelvis were performed. Transabdominal technique was performed for global imaging of the pelvis including uterus, ovaries, adnexal regions, and pelvic cul-de-sac. It was necessary to proceed with endovaginal exam following the transabdominal exam to visualize the uterus, endometrium and both ovaries. Color and duplex Doppler ultrasound was utilized to evaluate blood flow to the ovaries. COMPARISON:  11/12/2009 CT FINDINGS: Uterus Measurements: 13.8 x 7 x 6.6 cm. Uterus is anteverted. Right submucosal anterior uterine body 3 x 2.3 x 2.8 cm fibroid with anterior submucosal fundal to slightly left-sided 5.9 x 5.5 x 4.7 cm submucosal fibroid. Endometrium Thickness: 11 mm.  No focal abnormality visualized. Right ovary Measurements: 3.4 x 1.9 x 2.5 cm. Normal appearance/no adnexal mass. Left ovary Measurements: 3.6 x 2.8 x 2.4 cm. Normal appearance/no adnexal mass. Pulsed Doppler evaluation of both ovaries demonstrates normal low-resistance arterial and venous waveforms. Other findings Trace free fluid. IMPRESSION: Fibroid uterus with 2 submucosal fibroids identified larger which is fundal measuring 5.9 x 5.5 x 4.7 cm and the second which is right-sided and anterior measuring 3 x 2.3 x 2.8 cm. No ovarian torsion. Electronically Signed   By: Ashley Royalty M.D.   On: 11/04/2015 16:47   [place] Art/ven Flow Abd Pelv Doppler  Result  Date: 11/04/2015 CLINICAL DATA:  Right adnexal tenderness EXAM: TRANSABDOMINAL AND TRANSVAGINAL ULTRASOUND OF PELVIS DOPPLER ULTRASOUND OF OVARIES TECHNIQUE: Both transabdominal and transvaginal ultrasound examinations of the pelvis were performed. Transabdominal technique was performed for global imaging of the pelvis including uterus, ovaries, adnexal regions, and pelvic cul-de-sac. It was necessary to proceed with endovaginal exam following the transabdominal exam to visualize the uterus, endometrium and both ovaries. Color and duplex Doppler ultrasound was utilized to evaluate blood flow to the ovaries. COMPARISON:  11/12/2009 CT FINDINGS: Uterus Measurements: 13.8 x 7 x 6.6 cm. Uterus is anteverted. Right submucosal anterior uterine body 3 x 2.3 x 2.8 cm fibroid with anterior submucosal fundal to slightly left-sided 5.9 x 5.5 x 4.7 cm submucosal fibroid. Endometrium Thickness: 11 mm.  No focal abnormality visualized. Right ovary Measurements: 3.4 x  1.9 x 2.5 cm. Normal appearance/no adnexal mass. Left ovary Measurements: 3.6 x 2.8 x 2.4 cm. Normal appearance/no adnexal mass. Pulsed Doppler evaluation of both ovaries demonstrates normal low-resistance arterial and venous waveforms. Other findings Trace free fluid. IMPRESSION: Fibroid uterus with 2 submucosal fibroids identified larger which is fundal measuring 5.9 x 5.5 x 4.7 cm and the second which is right-sided and anterior measuring 3 x 2.3 x 2.8 cm. No ovarian torsion. Electronically Signed   By: Ashley Royalty M.D.   On: 11/04/2015 16:47    Procedures Procedures (including critical care time)  Medications Ordered in ED Medications  acetaminophen (TYLENOL) tablet 650 mg (650 mg Oral Given 11/04/15 1137)  sodium chloride 0.9 % bolus 1,000 mL (0 mLs Intravenous Stopped 11/04/15 1721)  ketorolac (TORADOL) 30 MG/ML injection 30 mg (30 mg Intravenous Given 11/04/15 1403)  prochlorperazine (COMPAZINE) injection 5 mg (5 mg Intravenous Given 11/04/15 1403)   diphenhydrAMINE (BENADRYL) injection 25 mg (25 mg Intravenous Given 11/04/15 1404)     Initial Impression / Assessment and Plan / ED Course  I have reviewed the triage vital signs and the nursing notes.  Pertinent labs & imaging results that were available during my care of the patient were reviewed by me and considered in my medical decision making (see chart for details).  Clinical Course   Patient feeling much better after Tylenol, Toradol, Compazine, Benadryl, fluid bolus.  Patient with most likely viral syndrome. Also fibroids and bacterial vaginosis. CBC shows WBC 14.5, hemoglobin 8.6, however patient has history of anemia. BMP shows sodium 134, glucose 104, calcium 8.7. UA negative. Wet prep shows clue cells and moderate WBCs. GC/chlamydia, HIV, RPR sent. Patient advised she will be called with these results in 2-3 days and that she is to seek treatment at the health Department if positive. Urine pregnancy negative. Rapid strep negative. CXR negative. Pelvic ultrasound shows hyperacute first with 270. Fibroids identified larger witches fundal measuring 5.9 x 5.5 x 4.7 cm and the second which is right-sided and anterior measuring 3 x 2.3 x 2.8 cm; No ovarian torsion. Discharge home with supportive treatment, as well as Flagyl to treat BV. Patient referred to outpatient women's clinic. Return precautions discussed. Patient understands and agrees with plan. Patient vitals stable at discharge and discharged in satisfactory condition.  Final Clinical Impressions(s) / ED Diagnoses   Final diagnoses:  Viral illness  Bacterial vaginosis  Submucous leiomyoma of uterus  Acute intractable headache, unspecified headache type    New Prescriptions Discharge Medication List as of 11/04/2015  5:12 PM    START taking these medications   Details  metroNIDAZOLE (FLAGYL) 500 MG tablet Take 1 tablet (500 mg total) by mouth 2 (two) times daily., Starting Wed 11/04/2015, Print    ondansetron  (ZOFRAN) 4 MG tablet Take 1 tablet (4 mg total) by mouth every 6 (six) hours., Starting Wed 11/04/2015, Print         Frederica Kuster, PA-C 11/04/15 2044    Isla Pence, MD 11/05/15 1319

## 2015-11-04 NOTE — Discharge Instructions (Signed)
Medications: Flagyl, Zofran  Treatment: Take Flagyl twice daily for 1 week. Do not drink alcohol with this medication. Take Zofran every 6 hours as needed for nausea and vomiting. You can take Dayquil as prescribed over-the-counter. You can take ibuprofen as prescribed over-the-counter every 4 hours. Make sure to drink plenty of fluids.  Follow-up: Please follow-up with the women's outpatient clinic for further evaluation of your fibroids as needed. Please follow-up and establish care with a primary care provider by calling the number circled on your discharge paperwork. Please return to emergency department if you develop any new or worsening symptoms.

## 2015-11-05 LAB — HIV ANTIBODY (ROUTINE TESTING W REFLEX): HIV Screen 4th Generation wRfx: NONREACTIVE

## 2015-11-05 LAB — CULTURE, GROUP A STREP (THRC)

## 2015-11-05 LAB — RPR: RPR Ser Ql: NONREACTIVE

## 2015-11-06 LAB — GC/CHLAMYDIA PROBE AMP (~~LOC~~) NOT AT ARMC
Chlamydia: NEGATIVE
Neisseria Gonorrhea: NEGATIVE

## 2016-03-01 ENCOUNTER — Emergency Department (HOSPITAL_COMMUNITY)
Admission: EM | Admit: 2016-03-01 | Discharge: 2016-03-02 | Disposition: A | Discharge status: Home / Self Care | Payer: Managed Care, Other (non HMO) | Attending: Emergency Medicine | Admitting: Emergency Medicine

## 2016-03-01 ENCOUNTER — Encounter (HOSPITAL_COMMUNITY): Payer: Self-pay | Admitting: Family Medicine

## 2016-03-01 DIAGNOSIS — R51 Headache: Secondary | ICD-10-CM | POA: Insufficient documentation

## 2016-03-01 DIAGNOSIS — N921 Excessive and frequent menstruation with irregular cycle: Secondary | ICD-10-CM | POA: Diagnosis not present

## 2016-03-01 DIAGNOSIS — R519 Headache, unspecified: Secondary | ICD-10-CM

## 2016-03-01 DIAGNOSIS — N939 Abnormal uterine and vaginal bleeding, unspecified: Secondary | ICD-10-CM | POA: Diagnosis present

## 2016-03-01 DIAGNOSIS — D649 Anemia, unspecified: Secondary | ICD-10-CM | POA: Diagnosis not present

## 2016-03-01 HISTORY — DX: Meningitis, unspecified: G03.9

## 2016-03-01 LAB — URINALYSIS, ROUTINE W REFLEX MICROSCOPIC
Bacteria, UA: NONE SEEN
Bilirubin Urine: NEGATIVE
GLUCOSE, UA: NEGATIVE mg/dL
Ketones, ur: NEGATIVE mg/dL
Nitrite: NEGATIVE
PH: 8 (ref 5.0–8.0)
Protein, ur: 100 mg/dL — AB
Specific Gravity, Urine: 1.03 (ref 1.005–1.030)
WBC, UA: NONE SEEN WBC/hpf (ref 0–5)

## 2016-03-01 LAB — COMPREHENSIVE METABOLIC PANEL
ALT: 16 U/L (ref 14–54)
AST: 18 U/L (ref 15–41)
Albumin: 4.5 g/dL (ref 3.5–5.0)
Alkaline Phosphatase: 51 U/L (ref 38–126)
Anion gap: 5 (ref 5–15)
BILIRUBIN TOTAL: 0.3 mg/dL (ref 0.3–1.2)
BUN: 10 mg/dL (ref 6–20)
CO2: 24 mmol/L (ref 22–32)
CREATININE: 0.63 mg/dL (ref 0.44–1.00)
Calcium: 9.3 mg/dL (ref 8.9–10.3)
Chloride: 109 mmol/L (ref 101–111)
Glucose, Bld: 87 mg/dL (ref 65–99)
POTASSIUM: 4.1 mmol/L (ref 3.5–5.1)
Sodium: 138 mmol/L (ref 135–145)
TOTAL PROTEIN: 8.6 g/dL — AB (ref 6.5–8.1)

## 2016-03-01 LAB — I-STAT BETA HCG BLOOD, ED (MC, WL, AP ONLY)

## 2016-03-01 MED ORDER — KETOROLAC TROMETHAMINE 30 MG/ML IJ SOLN
30.0000 mg | Freq: Once | INTRAMUSCULAR | Status: AC
  Administered 2016-03-01: 30 mg via INTRAVENOUS
  Filled 2016-03-01: qty 1

## 2016-03-01 MED ORDER — SODIUM CHLORIDE 0.9 % IV BOLUS (SEPSIS)
1000.0000 mL | Freq: Once | INTRAVENOUS | Status: AC
  Administered 2016-03-01: 1000 mL via INTRAVENOUS

## 2016-03-01 MED ORDER — METOCLOPRAMIDE HCL 5 MG/ML IJ SOLN
10.0000 mg | Freq: Once | INTRAMUSCULAR | Status: AC
  Administered 2016-03-01: 10 mg via INTRAVENOUS
  Filled 2016-03-01: qty 2

## 2016-03-01 NOTE — ED Triage Notes (Signed)
Patient reports she is experiencing a recurrent headache that started last night. Associated symptoms are dizziness and nausea. Also, complains of vaginal bleeding for the last 14 days. Pt has took IBUPROFEN with last dose being at 18:45. Pt is intermittently using cell phone while triaging.

## 2016-03-01 NOTE — ED Provider Notes (Signed)
[facility] Provider Note   CSN: [id number] Arrival date & time: 03/01/16  2039   By signing my name below, I, Ethelle Lyon Long, attest that this documentation has been prepared under the direction and in the presence of [employer], PA-C. Electronically Signed: Ethelle Lyon Long, Scribe. 03/01/2016. 11:46 PM.   History   Chief Complaint Chief Complaint  Patient presents with  . Headache  . Vaginal Bleeding     The history is provided by the patient. No language interpreter was used.    HPI Comments:  Angie Boyle is a 26 y.o. female with a PMHx of Anemia, who presents to the Emergency Department complaining of a constant frontal HA onset this morning. She notes she woke up this morning and felt a throbbing, pressurized HA. She notes a h/o HA's prior to today but states this pain feels dissimilar from previous episodes. The pain does not radiate. She has associated symptoms of room spinning dizziness that has improved since onset, nausea, photophobia, blurry vision, and vaginal bleeding.  She took ibuprofen PTA with mild relief of her symptoms. She additionally notes spotted vaginal bleeding 02/12/16-02/15/16 at which point it became heavy bleeding 02/15/16-02/20/16 and resolved. The bleeding restarted on 02/23/16 and has been described as heavy bleeding every day since. She notes she is passing clots daily. Per pt, she went through two tampons and eight pads (two at a time) yesterday for the large amount of blood loss. She denies h/o blood transfusions. She has an appointment with her OBGYN on 03/03/16. No h/o anxiety. Pt denies vomiting, phonophobia, ear pain, ringing in her ears, and any other associated symptoms at this time. LMP- 02/09/16   Past Medical History:  Diagnosis Date  . Meningitis     There are no active problems to display for this patient.   History reviewed. No pertinent surgical history.  OB History    No data available       Home Medications    Prior to  Admission medications   Medication Sig Start Date End Date Taking? Authorizing Provider  ibuprofen (ADVIL,MOTRIN) 200 MG tablet Take 600 mg by mouth every 4 (four) hours as needed for fever, headache, mild pain, moderate pain or cramping.    Yes Historical Provider, MD  Multiple Vitamins-Minerals (ADULT GUMMY) CHEW Chew 2 each by mouth daily with breakfast. *Vitafusion*   Yes Historical Provider, MD  butalbital-acetaminophen-caffeine (FIORICET, ESGIC) 50-325-40 MG tablet Take 1-2 tablets by mouth every 8 (eight) hours as needed for headache. 03/02/16 03/02/17  Antonietta Breach, PA-C    Family History History reviewed. No pertinent family history.  Social History Social History  Substance Use Topics  . Smoking status: Current Every Day Smoker    Types: Cigarettes  . Smokeless tobacco: Never Used  . Alcohol use Yes     Comment: Once a month     Allergies   Vancomycin   Review of Systems Review of Systems 10 systems reviewed and all are negative for acute change except as noted in the HPI.    Physical Exam Updated Vital Signs BP 118/61 (BP Location: Right Arm)   Pulse 79   Temp 98.9 F (37.2 C) (Oral)   Resp 18   Ht 5\' 3"  (1.6 m)   Wt 113.4 kg   LMP 02/03/2016   SpO2 100%   BMI 44.27 kg/m   Physical Exam  Constitutional: She is oriented to person, place, and time. She appears well-developed and well-nourished. No distress.  Nontoxic and in NAD  HENT:  Head: Normocephalic and atraumatic.  Mouth/Throat: Oropharynx is clear and moist.  Eyes: Conjunctivae and EOM are normal. No scleral icterus.  Neck: Normal range of motion.  Cardiovascular: Normal rate, normal heart sounds and intact distal pulses.   Pulmonary/Chest: Effort normal. No respiratory distress.  Respirations even and unlabored  Genitourinary: There is no rash, tenderness or lesion on the right labia. There is no rash, tenderness or lesion on the left labia. Uterus is not tender. Cervix exhibits no motion  tenderness and no friability. Right adnexum displays no mass and no tenderness. Left adnexum displays no mass and no tenderness. There is bleeding in the vagina.  Genitourinary Comments: Mild amount of blood in vaginal vault with clots. Blood coming from cervical os. No vaginal trauma or lacerations. No CMT or adnexal TTP. No friability.  Musculoskeletal: Normal range of motion.  Neurological: She is alert and oriented to person, place, and time. No cranial nerve deficit. She exhibits normal muscle tone. Coordination normal.  GCS 15. Speech is goal oriented. No focal neurologic deficits appreciated.  Skin: Skin is warm and dry. No rash noted. She is not diaphoretic. No erythema. No pallor.  Psychiatric: She has a normal mood and affect. Her behavior is normal.  Nursing note and vitals reviewed.    ED Treatments / Results  DIAGNOSTIC STUDIES:  Oxygen Saturation is 100% on RA, normal by my interpretation.    COORDINATION OF CARE:  11:31 PM Discussed treatment plan with pt at bedside including pelvic exam and lab work and pt agreed to plan.  Labs (all labs ordered are listed, but only abnormal results are displayed) Labs Reviewed  WET PREP, GENITAL - Abnormal; Notable for the following:       Result Value   WBC, Wet Prep HPF POC MODERATE (*)    All other components within normal limits  CBC WITH DIFFERENTIAL/PLATELET - Abnormal; Notable for the following:    Hemoglobin 8.1 (*)    HCT 27.3 (*)    MCV 58.5 (*)    MCH 17.3 (*)    MCHC 29.7 (*)    RDW 19.1 (*)    All other components within normal limits  COMPREHENSIVE METABOLIC PANEL - Abnormal; Notable for the following:    Total Protein 8.6 (*)    All other components within normal limits  URINALYSIS, ROUTINE W REFLEX MICROSCOPIC - Abnormal; Notable for the following:    APPearance HAZY (*)    Hgb urine dipstick LARGE (*)    Protein, ur 100 (*)    Leukocytes, UA TRACE (*)    Squamous Epithelial / LPF 0-5 (*)    All other  components within normal limits  I-STAT BETA HCG BLOOD, ED ([facility], [facility], [facility] ONLY)  GC/CHLAMYDIA PROBE AMP ([place]) NOT AT [facility]    EKG  EKG Interpretation None       Radiology No results found.  Procedures Procedures (including critical care time)  Medications Ordered in ED Medications  sodium chloride 0.9 % bolus 1,000 mL (0 mLs Intravenous Stopped 03/02/16 0136)  ketorolac (TORADOL) 30 MG/ML injection 30 mg (30 mg Intravenous Given 03/01/16 2350)  metoCLOPramide (REGLAN) injection 10 mg (10 mg Intravenous Given 03/01/16 2350)  diphenhydrAMINE (BENADRYL) injection 25 mg (25 mg Intravenous Given 03/02/16 0020)     Initial Impression / Assessment and Plan / ED Course  I have reviewed the triage vital signs and the nursing notes.  Pertinent labs & imaging results that were available during my care of the patient  were reviewed by me and considered in my medical decision making (see chart for details).     26 year old female presents to the emergency department for evaluation of a frontal headache. She believes that this may be secondary to anemia as she has had fairly constant vaginal bleeding over the past 8 days. Patient was found to be anemic, but this is consistent with her baseline. She has no other signs of acute blood loss such as tachycardia or hypotension. Patient with no complaints of abdominal pain with her vaginal bleeding. No indication for emergent imaging.   On repeat assessment, the patient reports that her headache has resolved with Toradol and Reglan. Neurologic exam was nonfocal. The patient is now requesting discharge. I do not see indication for further emergent workup. The patient has follow-up with her OB/GYN in 1 day regarding her abnormal uterine bleeding. Return precautions discussed and provided. Patient discharged in stable condition with no unaddressed concerns.   Vitals:   03/01/16 2050 03/01/16 2358  BP: 149/76 118/61  Pulse: 86 79  Resp: 18 18    Temp: 98.9 F (37.2 C)   TempSrc: Oral   SpO2: 100% 100%  Weight: 113.4 kg   Height: 5\' 3"  (1.6 m)      Final Clinical Impressions(s) / ED Diagnoses   Final diagnoses:  Metrorrhagia  Bad headache  Chronic anemia    New Prescriptions Discharge Medication List as of 03/02/2016  2:22 AM    START taking these medications   Details  butalbital-acetaminophen-caffeine (FIORICET, ESGIC) 50-325-40 MG tablet Take 1-2 tablets by mouth every 8 (eight) hours as needed for headache., Starting Wed 03/02/2016, Until Thu 03/02/2017, Print        I personally performed the services described in this documentation, which was scribed in my presence. The recorded information has been reviewed and is accurate.       Antonietta Breach, PA-C 03/02/16 [id number]    Merryl Hacker, MD 03/03/16 [phone]

## 2016-03-02 LAB — CBC WITH DIFFERENTIAL/PLATELET
Basophils Absolute: 0.1 10*3/uL (ref 0.0–0.1)
Basophils Relative: 1 %
EOS ABS: 0.1 10*3/uL (ref 0.0–0.7)
Eosinophils Relative: 2 %
HCT: 27.3 % — ABNORMAL LOW (ref 36.0–46.0)
Hemoglobin: 8.1 g/dL — ABNORMAL LOW (ref 12.0–15.0)
Lymphocytes Relative: 51 %
Lymphs Abs: 3.3 10*3/uL (ref 0.7–4.0)
MCH: 17.3 pg — AB (ref 26.0–34.0)
MCHC: 29.7 g/dL — AB (ref 30.0–36.0)
MCV: 58.5 fL — ABNORMAL LOW (ref 78.0–100.0)
MONO ABS: 0.3 10*3/uL (ref 0.1–1.0)
Monocytes Relative: 5 %
NEUTROS ABS: 2.6 10*3/uL (ref 1.7–7.7)
NEUTROS PCT: 41 %
PLATELETS: 304 10*3/uL (ref 150–400)
RBC: 4.67 MIL/uL (ref 3.87–5.11)
RDW: 19.1 % — ABNORMAL HIGH (ref 11.5–15.5)
WBC: 6.4 10*3/uL (ref 4.0–10.5)

## 2016-03-02 LAB — WET PREP, GENITAL
Clue Cells Wet Prep HPF POC: NONE SEEN
Sperm: NONE SEEN
Trich, Wet Prep: NONE SEEN
YEAST WET PREP: NONE SEEN

## 2016-03-02 MED ORDER — DIPHENHYDRAMINE HCL 50 MG/ML IJ SOLN
25.0000 mg | Freq: Once | INTRAMUSCULAR | Status: AC
  Administered 2016-03-02: 25 mg via INTRAVENOUS
  Filled 2016-03-02: qty 1

## 2016-03-02 MED ORDER — BUTALBITAL-APAP-CAFFEINE 50-325-40 MG PO TABS: 1.0000 | ORAL_TABLET | Freq: Three times a day (TID) | ORAL | 0 refills | Status: DC | PRN

## 2016-03-03 ENCOUNTER — Ambulatory Visit (INDEPENDENT_AMBULATORY_CARE_PROVIDER_SITE_OTHER): Payer: Managed Care, Other (non HMO) | Admitting: Obstetrics & Gynecology

## 2016-03-03 ENCOUNTER — Encounter: Payer: Self-pay | Admitting: Obstetrics & Gynecology

## 2016-03-03 VITALS — BP 120/82 | HR 66 | Ht 63.0 in | Wt 250.0 lb

## 2016-03-03 DIAGNOSIS — N920 Excessive and frequent menstruation with regular cycle: Secondary | ICD-10-CM | POA: Insufficient documentation

## 2016-03-03 DIAGNOSIS — Z0000 Encounter for general adult medical examination without abnormal findings: Secondary | ICD-10-CM

## 2016-03-03 DIAGNOSIS — Z01419 Encounter for gynecological examination (general) (routine) without abnormal findings: Secondary | ICD-10-CM

## 2016-03-03 DIAGNOSIS — D250 Submucous leiomyoma of uterus: Secondary | ICD-10-CM

## 2016-03-03 DIAGNOSIS — N924 Excessive bleeding in the premenopausal period: Secondary | ICD-10-CM

## 2016-03-03 DIAGNOSIS — D259 Leiomyoma of uterus, unspecified: Secondary | ICD-10-CM | POA: Insufficient documentation

## 2016-03-03 DIAGNOSIS — Z124 Encounter for screening for malignant neoplasm of cervix: Secondary | ICD-10-CM

## 2016-03-03 LAB — POCT URINE PREGNANCY: Preg Test, Ur: NEGATIVE

## 2016-03-03 MED ORDER — MEDROXYPROGESTERONE ACETATE 10 MG PO TABS: 20.0000 mg | ORAL_TABLET | Freq: Every day | ORAL | 2 refills | Status: DC

## 2016-03-03 NOTE — Progress Notes (Signed)
Patient ID: Angie Boyle, female   DOB: July 19, 1990, 26 y.o.   MRN: [id number]  Chief Complaint  Patient presents with  . New Patient (Initial Visit)  heavy period and fibroid uterus  HPI Angie Boyle is a 26 y.o. female.  G0P0000 Patient's last menstrual period was 02/03/2016. Heavy period since LMP, with fibroids dx by [place] 01/2015, submucosal. No sure when her last pap was done, using no BCM but not wishing to conceive HPI  Past Medical History:  Diagnosis Date  . Meningitis   . Migraines     History reviewed. No pertinent surgical history.  Family History  Problem Relation Age of Onset  . Fibroids Mother   . Diabetes Maternal Grandmother   . Hypertension Maternal Grandmother     Social History Social History  Substance Use Topics  . Smoking status: Current Every Day Smoker    Types: Cigarettes  . Smokeless tobacco: Never Used  . Alcohol use Yes     Comment: Once a month    Allergies  Allergen Reactions  . Vancomycin Other (See Comments)    Red man's syndrome  . Reglan [Metoclopramide] Anxiety    Current Outpatient Prescriptions  Medication Sig Dispense Refill  . ibuprofen (ADVIL,MOTRIN) 200 MG tablet Take 600 mg by mouth every 4 (four) hours as needed for fever, headache, mild pain, moderate pain or cramping.     . Multiple Vitamins-Minerals (ADULT GUMMY) CHEW Chew 2 each by mouth daily with breakfast. *Vitafusion*    . butalbital-acetaminophen-caffeine (FIORICET, ESGIC) 50-325-40 MG tablet Take 1-2 tablets by mouth every 8 (eight) hours as needed for headache. (Patient not taking: Reported on 03/03/2016) 20 tablet 0  . medroxyPROGESTERone (PROVERA) 10 MG tablet Take 2 tablets (20 mg total) by mouth daily. 30 tablet 2   No current facility-administered medications for this visit.     Review of Systems Review of Systems  Constitutional: Negative.   Cardiovascular: Negative.   Gastrointestinal: Negative.   Genitourinary: Positive for menstrual problem, pelvic  pain (cramps with bleeding) and vaginal bleeding. Negative for vaginal discharge.    Blood pressure 120/82, pulse 66, height 5\' 3"  (1.6 m), weight 250 lb (113.4 kg), last menstrual period 02/03/2016.  Physical Exam Physical Exam  Constitutional: She is oriented to person, place, and time. She appears well-developed. No distress.  obese  HENT:  Head: Normocephalic.  Cardiovascular: Normal rate.   Pulmonary/Chest: Effort normal. No respiratory distress.  Breasts: breasts appear normal, no suspicious masses, no skin or nipple changes or axillary nodes.   Abdominal: Soft. She exhibits no mass. There is no tenderness.  Genitourinary: Vagina normal.  Genitourinary Comments: Uterus 8 week size, no adnexal mass  Neurological: She is alert and oriented to person, place, and time.  Skin: Skin is warm and dry. No pallor.  Psychiatric: She has a normal mood and affect. Her behavior is normal.    Data Reviewed CLINICAL DATA:  Right adnexal tenderness  EXAM: TRANSABDOMINAL AND TRANSVAGINAL ULTRASOUND OF PELVIS  DOPPLER ULTRASOUND OF OVARIES  TECHNIQUE: Both transabdominal and transvaginal ultrasound examinations of the pelvis were performed. Transabdominal technique was performed for global imaging of the pelvis including uterus, ovaries, adnexal regions, and pelvic cul-de-sac.  It was necessary to proceed with endovaginal exam following the transabdominal exam to visualize the uterus, endometrium and both ovaries. Color and duplex Doppler ultrasound was utilized to evaluate blood flow to the ovaries.  COMPARISON:  11/12/2009 CT  FINDINGS: Uterus  Measurements: 13.8 x 7 x 6.6 cm. Uterus is anteverted.  Right submucosal anterior uterine body 3 x 2.3 x 2.8 cm fibroid with anterior submucosal fundal to slightly left-sided 5.9 x 5.5 x 4.7 cm submucosal fibroid.  Endometrium  Thickness: 11 mm.  No focal abnormality visualized.  Right ovary  Measurements: 3.4 x 1.9 x  2.5 cm. Normal appearance/no adnexal mass.  Left ovary  Measurements: 3.6 x 2.8 x 2.4 cm. Normal appearance/no adnexal mass.  Pulsed Doppler evaluation of both ovaries demonstrates normal low-resistance arterial and venous waveforms.  Other findings  Trace free fluid.  IMPRESSION: Fibroid uterus with 2 submucosal fibroids identified larger which is fundal measuring 5.9 x 5.5 x 4.7 cm and the second which is right-sided and anterior measuring 3 x 2.3 x 2.8 cm.  No ovarian torsion.   Electronically Signed   By: Ashley Royalty M.D.   On: 11/04/2015 16:47   Assessment    Patient Active Problem List   Diagnosis Date Noted  . Fibroid uterus 03/03/2016  . Menorrhagia with regular cycle 03/03/2016   Well woman exam    Plan    Sono hysterogram ordered CBC, patient requests STD screening  Pap result f/u Provera 20 mg daily RTC 4 weeks       Emeterio Reeve 03/03/2016, 1:29 PM

## 2016-03-03 NOTE — Progress Notes (Signed)
Here today for excessive bleeding, annual, and pap smear. Just started in January on 17th.  Period 1/17-1/23, another one 1/26-2/1 another bleed started 2/6 still bleeding changing pads every 2hrs.  Have been on pill in the past, not on birth control now. Not interested in birth control at this time.

## 2016-03-04 LAB — CYTOLOGY - PAP
Chlamydia: NEGATIVE
DIAGNOSIS: NEGATIVE
NEISSERIA GONORRHEA: NEGATIVE

## 2016-03-04 LAB — CBC
Hematocrit: 25 % — ABNORMAL LOW (ref 34.0–46.6)
Hemoglobin: 7.2 g/dL — ABNORMAL LOW (ref 11.1–15.9)
MCH: 17.3 pg — ABNORMAL LOW (ref 26.6–33.0)
MCHC: 28.8 g/dL — ABNORMAL LOW (ref 31.5–35.7)
MCV: 60 fL — ABNORMAL LOW (ref 79–97)
PLATELETS: 281 10*3/uL (ref 150–379)
RBC: 4.16 x10E6/uL (ref 3.77–5.28)
RDW: 19.3 % — AB (ref 12.3–15.4)
WBC: 4.2 10*3/uL (ref 3.4–10.8)

## 2016-03-04 LAB — HEPATITIS B SURFACE ANTIGEN: Hepatitis B Surface Ag: NEGATIVE

## 2016-03-04 LAB — RPR: RPR Ser Ql: NONREACTIVE

## 2016-03-04 LAB — GC/CHLAMYDIA PROBE AMP (~~LOC~~) NOT AT ARMC
CHLAMYDIA, DNA PROBE: NEGATIVE
NEISSERIA GONORRHEA: NEGATIVE

## 2016-03-04 LAB — HEPATITIS C ANTIBODY

## 2016-03-04 LAB — HIV ANTIBODY (ROUTINE TESTING W REFLEX): HIV SCREEN 4TH GENERATION: NONREACTIVE

## 2016-03-10 ENCOUNTER — Ambulatory Visit (HOSPITAL_COMMUNITY)
Admission: RE | Admit: 2016-03-10 | Discharge: 2016-03-10 | Disposition: A | Discharge status: Home / Self Care | Payer: Managed Care, Other (non HMO) | Source: Ambulatory Visit | Attending: Obstetrics & Gynecology | Admitting: Obstetrics & Gynecology

## 2016-03-10 DIAGNOSIS — N924 Excessive bleeding in the premenopausal period: Secondary | ICD-10-CM

## 2016-03-10 DIAGNOSIS — D250 Submucous leiomyoma of uterus: Secondary | ICD-10-CM

## 2016-03-10 DIAGNOSIS — N939 Abnormal uterine and vaginal bleeding, unspecified: Secondary | ICD-10-CM | POA: Insufficient documentation

## 2016-03-10 DIAGNOSIS — Z01419 Encounter for gynecological examination (general) (routine) without abnormal findings: Secondary | ICD-10-CM

## 2016-03-16 ENCOUNTER — Ambulatory Visit (HOSPITAL_COMMUNITY): Admission: RE | Admit: 2016-03-16 | Payer: Managed Care, Other (non HMO) | Source: Ambulatory Visit

## 2016-03-16 ENCOUNTER — Telehealth: Payer: Self-pay | Admitting: *Deleted

## 2016-03-16 NOTE — Telephone Encounter (Signed)
Patient called to let Dr Roselie Awkward know that she is still bleeding. Patient states she came to the office for AUB and she was sent for a [place] and started on Provera 20mg  . She is on the 11th day of the provera with little change to her bleeding- it is going from light to heavy. Told patient I would let her provider know and see what his recommendation for her is. She will check back tomorrow for a response.

## 2016-03-17 ENCOUNTER — Telehealth: Payer: Self-pay

## 2016-03-17 DIAGNOSIS — N939 Abnormal uterine and vaginal bleeding, unspecified: Secondary | ICD-10-CM

## 2016-03-17 MED ORDER — MEGESTROL ACETATE 40 MG PO TABS: 40.0000 mg | ORAL_TABLET | Freq: Two times a day (BID) | ORAL | 0 refills | Status: DC

## 2016-03-17 NOTE — Telephone Encounter (Signed)
Patient called in to follow up and states that bleeding has continued. Advised patient that per dr. Rip Harbour we would send in another rx today and that she needs to be seen next week to follow up with Dr. Roselie Awkward. Advised that scheduler would contact her for appt time.

## 2016-03-23 ENCOUNTER — Ambulatory Visit (INDEPENDENT_AMBULATORY_CARE_PROVIDER_SITE_OTHER): Payer: Managed Care, Other (non HMO) | Admitting: Obstetrics & Gynecology

## 2016-03-23 ENCOUNTER — Encounter (HOSPITAL_COMMUNITY): Payer: Self-pay

## 2016-03-23 VITALS — BP 138/83 | HR 98 | Wt 249.0 lb

## 2016-03-23 DIAGNOSIS — D250 Submucous leiomyoma of uterus: Secondary | ICD-10-CM | POA: Diagnosis not present

## 2016-03-23 DIAGNOSIS — N939 Abnormal uterine and vaginal bleeding, unspecified: Secondary | ICD-10-CM | POA: Diagnosis not present

## 2016-03-23 MED ORDER — MEGESTROL ACETATE 40 MG PO TABS: 40.0000 mg | ORAL_TABLET | Freq: Two times a day (BID) | ORAL | 2 refills | Status: AC

## 2016-03-23 NOTE — Patient Instructions (Signed)
Hysteroscopy  Hysteroscopy is a procedure used for looking inside the womb (uterus). It may be done for various reasons, including:  · To evaluate abnormal bleeding, fibroid (benign, noncancerous) tumors, polyps, scar tissue (adhesions), and possibly cancer of the uterus.  · To look for lumps (tumors) and other uterine growths.  · To look for causes of why a woman cannot get pregnant (infertility), causes of recurrent loss of pregnancy (miscarriages), or a lost intrauterine device (IUD).  · To perform a sterilization by blocking the fallopian tubes from inside the uterus.    In this procedure, a thin, flexible tube with a tiny light and camera on the end of it (hysteroscope) is used to look inside the uterus. A hysteroscopy should be done right after a menstrual period to be sure you are not pregnant.  LET YOUR HEALTH CARE PROVIDER KNOW ABOUT:  · Any allergies you have.  · All medicines you are taking, including vitamins, herbs, eye drops, creams, and over-the-counter medicines.  · Previous problems you or members of your family have had with the use of anesthetics.  · Any blood disorders you have.  · Previous surgeries you have had.  · Medical conditions you have.  RISKS AND COMPLICATIONS  Generally, this is a safe procedure. However, as with any procedure, complications can occur. Possible complications include:  · Putting a hole in the uterus.  · Excessive bleeding.  · Infection.  · Damage to the cervix.  · Injury to other organs.  · Allergic reaction to medicines.  · Too much fluid used in the uterus for the procedure.    BEFORE THE PROCEDURE  · Ask your health care provider about changing or stopping any regular medicines.  · Do not take aspirin or blood thinners for 1 week before the procedure, or as directed by your health care provider. These can cause bleeding.  · If you smoke, do not smoke for 2 weeks before the procedure.  · In some cases, a medicine is placed in the cervix the day before the procedure.  This medicine makes the cervix have a larger opening (dilate). This makes it easier for the instrument to be inserted into the uterus during the procedure.  · Do not eat or drink anything for at least 8 hours before the surgery.  · Arrange for someone to take you home after the procedure.  PROCEDURE  · You may be given a medicine to relax you (sedative). You may also be given one of the following:  ? A medicine that numbs the area around the cervix (local anesthetic).  ? A medicine that makes you sleep through the procedure (general anesthetic).  · The hysteroscope is inserted through the vagina into the uterus. The camera on the hysteroscope sends a picture to a TV screen. This gives the surgeon a good view inside the uterus.  · During the procedure, air or a liquid is put into the uterus, which allows the surgeon to see better.  · Sometimes, tissue is gently scraped from inside the uterus. These tissue samples are sent to a lab for testing.  What to expect after the procedure  · If you had a general anesthetic, you may be groggy for a couple hours after the procedure.  · If you had a local anesthetic, you will be able to go home as soon as you are stable and feel ready.  · You may have some cramping. This normally lasts for a couple days.  · You may   have bleeding, which varies from light spotting for a few days to menstrual-like bleeding for 3-7 days. This is normal.  · If your test results are not back during the visit, make an appointment with your health care provider to find out the results.  This information is not intended to replace advice given to you by your health care provider. Make sure you discuss any questions you have with your health care provider.  Document Released: 04/11/2000 Document Revised: 06/11/2015 Document Reviewed: 08/02/2012  Elsevier Interactive Patient Education © 2017 [employer]

## 2016-03-23 NOTE — Progress Notes (Signed)
Pt in office for AUB. Pt was given Megace last week. Pt states that her bleeding has been lighter since taking Megace.  Pt was advised to follow up this week. Pt states that she has had u/s and would like to discuss results / plan.

## 2016-03-23 NOTE — Progress Notes (Signed)
Patient ID: Angie Boyle, female   DOB: 04-28-1990, 26 y.o.   MRN: [id number]  Chief Complaint  Patient presents with  . Follow-up    HPI Angie Boyle is a 26 y.o. female.  G0P0000 No LMP recorded.She has only spotting now Megace prescribed since her last visit. Her HSG result was reviewed and an intracavitary fibroid was seen and a larger submucosal fibroid.  HPI  Past Medical History:  Diagnosis Date  . Meningitis   . Migraines     No past surgical history on file.  Family History  Problem Relation Age of Onset  . Fibroids Mother   . Diabetes Maternal Grandmother   . Hypertension Maternal Grandmother     Social History Social History  Substance Use Topics  . Smoking status: Current Every Day Smoker    Types: Cigarettes  . Smokeless tobacco: Never Used  . Alcohol use Yes     Comment: Once a month    Allergies  Allergen Reactions  . Vancomycin Other (See Comments)    Red man's syndrome  . Reglan [Metoclopramide] Anxiety    Current Outpatient Prescriptions  Medication Sig Dispense Refill  . megestrol (MEGACE) 40 MG tablet Take 1 tablet (40 mg total) by mouth 2 (two) times daily. 60 tablet 2  . Multiple Vitamins-Minerals (ADULT GUMMY) CHEW Chew 2 each by mouth daily with breakfast. *Vitafusion*    . butalbital-acetaminophen-caffeine (FIORICET, ESGIC) 50-325-40 MG tablet Take 1-2 tablets by mouth every 8 (eight) hours as needed for headache. (Patient not taking: Reported on 03/03/2016) 20 tablet 0  . ibuprofen (ADVIL,MOTRIN) 200 MG tablet Take 600 mg by mouth every 4 (four) hours as needed for fever, headache, mild pain, moderate pain or cramping.      No current facility-administered medications for this visit.     Review of Systems Review of Systems  Constitutional: Negative.   Respiratory: Negative.   Genitourinary: Positive for vaginal bleeding (spotting ). Negative for pelvic pain.    Blood pressure 138/83, pulse 98, weight 249 lb (112.9  kg).  Physical Exam Physical Exam  Constitutional: She appears well-developed. No distress.  Cardiovascular: Normal rate.   Psychiatric: She has a normal mood and affect. Her behavior is normal.    Data Reviewed CLINICAL DATA:  Abnormal uterine bleeding. Submucosal uterine fibroids.  EXAM: [place] SONOHYSTEROGRAM  TECHNIQUE: Following cleansing of the cervix and vagina with Betadine, a hysterosalpingogram catheter was placed within the endocervical canal. Sonohysterogram was then performed with transvaginal sonography during infusion of sterile saline solution into the endometrial cavity.  COMPARISON:  Pelvic ultrasound on 11/04/2015  FINDINGS: An intracavitary fibroid is seen in the inferior endometrial cavity. This is located in the right lateral wall, and measures 2.9 x 2.3 by 3.2 cm. This shows no significant change in size compared to recent exam.  A submucosal fibroid is seen in the right upper uterine corpus and fundus, which displaces the endometrial cavity to the left. This fibroid measures 6.2 x 6.1 x 5.8 cm. This shows no significant change in size compared to recent exam.  No endometrial masses or polyps are visualized.  IMPRESSION: 3.2 cm intracavitary fibroid in the lower uterine segment.  6.2 cm submucosal fibroid in the right upper uterine corpus and fundus.   Electronically Signed   By: Earle Gell M.D.   On: 03/10/2016 10:18   Vitals   Height Weight BMI (Calculated)  5\' 3"  (1.6 m) 249 lb (112.9 kg) 44.4  External Result Report   External Result Report  Imaging   Imaging Information  Signed by   Signed Date/Time  Phone Pager  Earle Gell 03/10/2016 10:18 AM [phone]  Signed   Electronically signed by Earle Gell, MD on 03/10/16 at 1018 EST     Assessment    Patient Active Problem List   Diagnosis Date Noted  . Fibroid uterus 03/03/2016  . Menorrhagia with regular cycle 03/03/2016       Plan    I  offered hysteroscopic myomectomy. Patient desires surgical management .  The risks of surgery were discussed in detail with the patient including but not limited to: bleeding which may require transfusion or reoperation; infection which may require prolonged hospitalization or re-hospitalization and antibiotic therapy; injury to bowel, bladder, ureters and major vessels or other surrounding organs; need for additional procedures including laparotomy; thromboembolic phenomenon, incisional problems and other postoperative or anesthesia complications.  Patient was told that the likelihood that her condition and symptoms will be treated effectively with this surgical management was very high; the postoperative expectations were also discussed in detail. The patient also understands the alternative treatment options which were discussed in full. All questions were answered.  She was told that she will be contacted by our surgical scheduler regarding the time and date of her surgery; routine preoperative instructions of having nothing to eat or drink after midnight on the day prior to surgery and also coming to the hospital 1.5 hours prior to her time of surgery were also emphasized.  She was told she may be called for a preoperative appointment about a week prior to surgery and will be given further preoperative instructions at that visit. Printed patient education handouts about the procedure were given to the patient to review at home.         Emeterio Reeve 03/23/2016, 8:52 AM

## 2016-03-31 ENCOUNTER — Ambulatory Visit: Payer: Managed Care, Other (non HMO) | Admitting: Obstetrics and Gynecology

## 2016-05-25 ENCOUNTER — Emergency Department (HOSPITAL_BASED_OUTPATIENT_CLINIC_OR_DEPARTMENT_OTHER)
Admission: EM | Admit: 2016-05-25 | Discharge: 2016-05-25 | Disposition: A | Discharge status: Home / Self Care | Payer: Managed Care, Other (non HMO) | Attending: Emergency Medicine | Admitting: Emergency Medicine

## 2016-05-25 ENCOUNTER — Encounter (HOSPITAL_BASED_OUTPATIENT_CLINIC_OR_DEPARTMENT_OTHER): Payer: Self-pay | Admitting: *Deleted

## 2016-05-25 ENCOUNTER — Emergency Department (HOSPITAL_BASED_OUTPATIENT_CLINIC_OR_DEPARTMENT_OTHER): Payer: Managed Care, Other (non HMO)

## 2016-05-25 DIAGNOSIS — R1030 Lower abdominal pain, unspecified: Secondary | ICD-10-CM | POA: Diagnosis present

## 2016-05-25 DIAGNOSIS — D259 Leiomyoma of uterus, unspecified: Secondary | ICD-10-CM | POA: Insufficient documentation

## 2016-05-25 DIAGNOSIS — F17210 Nicotine dependence, cigarettes, uncomplicated: Secondary | ICD-10-CM | POA: Diagnosis not present

## 2016-05-25 HISTORY — DX: Benign neoplasm of connective and other soft tissue, unspecified: D21.9

## 2016-05-25 LAB — CBC WITH DIFFERENTIAL/PLATELET
BASOS PCT: 1 %
Basophils Absolute: 0.1 10*3/uL (ref 0.0–0.1)
EOS PCT: 0 %
Eosinophils Absolute: 0 10*3/uL (ref 0.0–0.7)
HEMATOCRIT: 24.9 % — AB (ref 36.0–46.0)
HEMOGLOBIN: 7.3 g/dL — AB (ref 12.0–15.0)
LYMPHS ABS: 2.3 10*3/uL (ref 0.7–4.0)
LYMPHS PCT: 23 %
MCH: 15.8 pg — AB (ref 26.0–34.0)
MCHC: 29.3 g/dL — AB (ref 30.0–36.0)
MCV: 53.8 fL — AB (ref 78.0–100.0)
MONOS PCT: 8 %
Monocytes Absolute: 0.8 10*3/uL (ref 0.1–1.0)
NEUTROS ABS: 6.8 10*3/uL (ref 1.7–7.7)
Neutrophils Relative %: 68 %
Platelets: 666 10*3/uL — ABNORMAL HIGH (ref 150–400)
RBC: 4.63 MIL/uL (ref 3.87–5.11)
RDW: 21.7 % — AB (ref 11.5–15.5)
WBC: 10 10*3/uL (ref 4.0–10.5)

## 2016-05-25 LAB — COMPREHENSIVE METABOLIC PANEL
ALK PHOS: 52 U/L (ref 38–126)
ALT: 18 U/L (ref 14–54)
ANION GAP: 10 (ref 5–15)
AST: 19 U/L (ref 15–41)
Albumin: 4 g/dL (ref 3.5–5.0)
BILIRUBIN TOTAL: 0.1 mg/dL — AB (ref 0.3–1.2)
BUN: 10 mg/dL (ref 6–20)
CO2: 21 mmol/L — ABNORMAL LOW (ref 22–32)
Calcium: 9 mg/dL (ref 8.9–10.3)
Chloride: 108 mmol/L (ref 101–111)
Creatinine, Ser: 0.83 mg/dL (ref 0.44–1.00)
GFR calc non Af Amer: >60 mL/min (ref >60)
Glucose, Bld: 108 mg/dL — ABNORMAL HIGH (ref 65–99)
Potassium: 3.4 mmol/L — ABNORMAL LOW (ref 3.5–5.1)
SODIUM: 139 mmol/L (ref 135–145)
Total Protein: 8.5 g/dL — ABNORMAL HIGH (ref 6.5–8.1)

## 2016-05-25 LAB — URINALYSIS, ROUTINE W REFLEX MICROSCOPIC
BILIRUBIN URINE: NEGATIVE
GLUCOSE, UA: NEGATIVE mg/dL
KETONES UR: NEGATIVE mg/dL
Leukocytes, UA: NEGATIVE
NITRITE: NEGATIVE
PH: 5.5 (ref 5.0–8.0)
Protein, ur: 30 mg/dL — AB
SPECIFIC GRAVITY, URINE: 1.036 — AB (ref 1.005–1.030)

## 2016-05-25 LAB — LIPASE, BLOOD: LIPASE: 37 U/L (ref 11–51)

## 2016-05-25 LAB — URINALYSIS, MICROSCOPIC (REFLEX): WBC, UA: NONE SEEN WBC/hpf (ref 0–5)

## 2016-05-25 LAB — PREGNANCY, URINE: Preg Test, Ur: NEGATIVE

## 2016-05-25 MED ORDER — ONDANSETRON HCL 4 MG/2ML IJ SOLN
4.0000 mg | Freq: Once | INTRAMUSCULAR | Status: AC
  Administered 2016-05-25: 4 mg via INTRAVENOUS
  Filled 2016-05-25: qty 2

## 2016-05-25 MED ORDER — HYDROCODONE-ACETAMINOPHEN 5-325 MG PO TABS: 1.0000 | ORAL_TABLET | Freq: Four times a day (QID) | ORAL | 0 refills | Status: DC | PRN

## 2016-05-25 MED ORDER — HYDROMORPHONE HCL 1 MG/ML IJ SOLN
1.0000 mg | Freq: Once | INTRAMUSCULAR | Status: AC
  Administered 2016-05-25: 1 mg via INTRAVENOUS
  Filled 2016-05-25: qty 1

## 2016-05-25 MED ORDER — SODIUM CHLORIDE 0.9 % IV BOLUS (SEPSIS)
1000.0000 mL | Freq: Once | INTRAVENOUS | Status: AC
  Administered 2016-05-25: 1000 mL via INTRAVENOUS

## 2016-05-25 MED ORDER — NAPROXEN 500 MG PO TABS: 500.0000 mg | ORAL_TABLET | Freq: Two times a day (BID) | ORAL | 0 refills | Status: DC

## 2016-05-25 MED ORDER — SODIUM CHLORIDE 0.9 % IV SOLN: INTRAVENOUS | Status: DC

## 2016-05-25 NOTE — Patient Instructions (Signed)
Your procedure is scheduled on:  Thursday, Jun 02, 2016  Enter through the Main Entrance of [facility] at:  6:00 AM  Pick up the phone at the desk and dial [phone].  Call this number if you have problems the morning of surgery: [phone].  Remember: Do NOT eat food or drink after:  Midnight Wednesday  Take these medicines the morning of surgery with a SIP OF WATER:  None  Stop ALL herbal medications at this time  Do NOT smoke the day of surgery.  Do NOT wear jewelry (body piercing), metal hair clips/bobby pins, make-up, or nail polish. Do NOT wear lotions, powders, or perfumes.  You may wear deodorant. Do NOT shave for 48 hours prior to surgery. Do NOT bring valuables to the hospital. Contacts, dentures, or bridgework may not be worn into surgery.  Have a responsible adult drive you home and stay with you for 24 hours after your procedure  Bring a copy of your healthcare power of attorney and living will documents.

## 2016-05-25 NOTE — Discharge Instructions (Signed)
Take the medications as needed for pain, follow up with your GYN doctor

## 2016-05-25 NOTE — ED Notes (Signed)
ED Provider at bedside. 

## 2016-05-25 NOTE — ED Provider Notes (Signed)
[name]   CSN: [id number] Arrival date & time: 05/25/16  1831  By signing my name below, I, Higinio Plan, attest that this documentation has been prepared under the direction and in the presence of Dorie Rank, MD . Electronically Signed: Higinio Plan, Scribe. 05/25/2016. 7:22 PM.  History   Chief Complaint Chief Complaint  Patient presents with  . Back Pain   The history is provided by the patient. No language interpreter was used.   HPI Comments: Angie Boyle is a 26 y.o. female with PMHx of fibroids, meningitis, and kidney stones, brought in by EMS to the Emergency Department complaining of gradually worsening, left flank pain radiating into her abdomen and lower back that began this morning. Pt reports associated nausea, vomiting, and vaginal bleeding that also began today. She states she does not have regular menstrual cycles. She notes she is scheduled for surgery for fibroid removal on 5/17 by Dr. Roselie Awkward at [place] denies any fever, dysuria, or diarrhea.   Past Medical History:  Diagnosis Date  . Fibroids   . Meningitis   . Migraines    Patient Active Problem List   Diagnosis Date Noted  . Fibroid uterus 03/03/2016  . Menorrhagia with regular cycle 03/03/2016   History reviewed. No pertinent surgical history.  OB History    Gravida Para Term Preterm AB Living   0 0 0 0 0 0   SAB TAB Ectopic Multiple Live Births   0 0 0 0 0     Home Medications    Prior to Admission medications   Medication Sig Start Date End Date Taking? Authorizing Provider  butalbital-acetaminophen-caffeine (FIORICET, ESGIC) 50-325-40 MG tablet Take 1-2 tablets by mouth every 8 (eight) hours as needed for headache. Patient not taking: Reported on 03/03/2016 03/02/16 03/02/17  Antonietta Breach, PA-C  HYDROcodone-acetaminophen (NORCO/VICODIN) 5-325 MG tablet Take 1 tablet by mouth every 6 (six) hours as needed. 05/25/16   Dorie Rank, MD  ibuprofen (ADVIL,MOTRIN) 200  MG tablet Take 400 mg by mouth every 6 (six) hours as needed for mild pain.     [provider]  megestrol (MEGACE) 40 MG tablet Take 1 tablet (40 mg total) by mouth 2 (two) times daily. 03/23/16   Woodroe Mode, MD  naproxen (NAPROSYN) 500 MG tablet Take 1 tablet (500 mg total) by mouth 2 (two) times daily with a meal. As needed for pain 05/25/16   Dorie Rank, MD    Family History Family History  Problem Relation Age of Onset  . Fibroids Mother   . Diabetes Maternal Grandmother   . Hypertension Maternal Grandmother     Social History Social History  Substance Use Topics  . Smoking status: Current Every Day Smoker    Types: Cigarettes  . Smokeless tobacco: Never Used  . Alcohol use Yes     Comment: Once a month   Allergies   Vancomycin and Reglan [metoclopramide]  Review of Systems Review of Systems  Gastrointestinal: Positive for abdominal pain, nausea and vomiting. Negative for diarrhea.  Genitourinary: Positive for flank pain and vaginal bleeding. Negative for dysuria.  Musculoskeletal: Positive for back pain.   Physical Exam Updated Vital Signs BP (!) 145/93 (BP Location: Right Arm)   Pulse 99   Temp 98.4 F (36.9 C) (Oral)   Resp 16   Ht 5\' 7"  (1.702 m)   Wt 249 lb (112.9 kg)   SpO2 100%   BMI 39.00 kg/m   Physical Exam  Constitutional: She appears well-developed and well-nourished. She appears distressed.  HENT:  Head: Normocephalic and atraumatic.  Right Ear: External ear normal.  Left Ear: External ear normal.  Eyes: Conjunctivae are normal. Right eye exhibits no discharge. Left eye exhibits no discharge. No scleral icterus.  Neck: Neck supple. No tracheal deviation present.  Cardiovascular: Normal rate, regular rhythm and intact distal pulses.   Pulmonary/Chest: Effort normal and breath sounds normal. No stridor. No respiratory distress. She has no wheezes. She has no rales.  Abdominal: Soft. Bowel sounds are normal. She exhibits no distension.  There is no tenderness. There is CVA tenderness (left sided). There is no rebound and no guarding.  Musculoskeletal: She exhibits no edema or tenderness.  Neurological: She is alert. She has normal strength. No cranial nerve deficit (no facial droop, extraocular movements intact, no slurred speech) or sensory deficit. She exhibits normal muscle tone. She displays no seizure activity. Coordination normal.  Skin: Skin is warm and dry. No rash noted.  Psychiatric: She has a normal mood and affect.  Nursing note and vitals reviewed.  ED Treatments / Results  DIAGNOSTIC STUDIES:  Oxygen Saturation is 100% on RA, normal by my interpretation.    COORDINATION OF CARE:  7:13 PM Discussed treatment plan with pt at bedside and pt agreed to plan.   Radiology Ct Renal Stone Study  Result Date: 05/25/2016 CLINICAL DATA:  Acute onset left flank pain today.  Nephrolithiasis. EXAM: CT ABDOMEN AND PELVIS WITHOUT CONTRAST TECHNIQUE: Multidetector CT imaging of the abdomen and pelvis was performed following the standard protocol without IV contrast. COMPARISON:  11/12/2009 FINDINGS: Lower chest: No acute findings. Hepatobiliary: No masses visualized on this unenhanced exam. Gallbladder is unremarkable. Pancreas: No mass or inflammatory process visualized on this unenhanced exam. Spleen:  Within normal limits in size. Adrenals/Urinary tract: No evidence of urolithiasis or hydronephrosis. Unremarkable unopacified urinary bladder. Stomach/Bowel: No evidence of obstruction, inflammatory process, or abnormal fluid collections. Vascular/Lymphatic: No pathologically enlarged lymph nodes identified. No evidence of abdominal aortic aneurysm. Reproductive: Approximately 8 cm uterine fibroid seen within the central pelvis. Adnexal regions are unremarkable. Other:  None. Musculoskeletal:  No suspicious bone lesions identified. IMPRESSION: No evidence of urolithiasis, hydronephrosis, or other acute findings. Large uterine fibroid  measuring approximately 8 cm. Electronically Signed   By: Earle Gell M.D.   On: 05/25/2016 20:35   Procedures Procedures (including critical care time)  Medications Ordered in ED Medications  sodium chloride 0.9 % bolus 1,000 mL (1,000 mLs Intravenous New Bag/Given 05/25/16 1929)    And  0.9 %  sodium chloride infusion (not administered)  ondansetron (ZOFRAN) injection 4 mg (4 mg Intravenous Given 05/25/16 1930)  HYDROmorphone (DILAUDID) injection 1 mg (1 mg Intravenous Given 05/25/16 1930)    Initial Impression / Assessment and Plan / ED Course  I have reviewed the triage vital signs and the nursing notes.  Pertinent labs & imaging results that were available during my care of the patient were reviewed by me and considered in my medical decision making (see chart for details).  Clinical Course as of May 26 2110  Wed May 25, 2016  2107 Negative prescriptions in the [facility] data base  [JK]    Clinical Course User Index [JK] Dorie Rank, MD  Patient presented to the emergency room with severe flank and abdominal pain. Her symptoms were suggestive of a ureteral stone. Laboratory tests revealed a stable anemia. She did have hematuria.  CT scan however did not demonstrate any ureteral stone. She  did have a large uterine fibroid. Patient is aware of this diagnosis. She is scheduled for surgery. Recommend she follow up with her OB/GYN doctor. I did prescribe her medications for pain.   Final Clinical Impressions(s) / ED Diagnoses   Final diagnoses:  Lower abdominal pain  Uterine leiomyoma, unspecified location    New Prescriptions New Prescriptions   HYDROCODONE-ACETAMINOPHEN (NORCO/VICODIN) 5-325 MG TABLET    Take 1 tablet by mouth every 6 (six) hours as needed.   NAPROXEN (NAPROSYN) 500 MG TABLET    Take 1 tablet (500 mg total) by mouth 2 (two) times daily with a meal. As needed for pain   I personally performed the services described in this documentation, which was scribed in my presence.   The recorded information has been reviewed and is accurate.    Dorie Rank, MD 05/25/16 2112

## 2016-05-25 NOTE — ED Notes (Signed)
Pt amb out in hallway w/o difficulty requesting pain meds and wanting know "where is the doctor"

## 2016-05-25 NOTE — ED Notes (Signed)
Patient transported to CT 

## 2016-05-25 NOTE — ED Triage Notes (Signed)
Pt brought in by EMS for back pain and abd pain , pt has scheduled surgery for fibroid removal next week, requesting pain meds

## 2016-05-26 ENCOUNTER — Encounter (HOSPITAL_COMMUNITY): Payer: Self-pay

## 2016-05-26 ENCOUNTER — Encounter (HOSPITAL_COMMUNITY)
Admission: RE | Admit: 2016-05-26 | Discharge: 2016-05-26 | Disposition: A | Discharge status: Home / Self Care | Payer: Managed Care, Other (non HMO) | Source: Ambulatory Visit | Attending: Obstetrics & Gynecology | Admitting: Obstetrics & Gynecology

## 2016-05-26 ENCOUNTER — Telehealth: Payer: Self-pay | Admitting: *Deleted

## 2016-05-26 ENCOUNTER — Other Ambulatory Visit: Payer: Self-pay | Admitting: Obstetrics & Gynecology

## 2016-05-26 DIAGNOSIS — Z01812 Encounter for preprocedural laboratory examination: Secondary | ICD-10-CM | POA: Insufficient documentation

## 2016-05-26 HISTORY — DX: Anemia, unspecified: D64.9

## 2016-05-26 HISTORY — DX: Personal history of urinary calculi: Z87.442

## 2016-05-26 LAB — ABO/RH: ABO/RH(D): AB POS

## 2016-05-26 LAB — TYPE AND SCREEN
ABO/RH(D): AB POS
Antibody Screen: NEGATIVE

## 2016-05-26 LAB — CBC
HCT: 26.1 % — ABNORMAL LOW (ref 36.0–46.0)
Hemoglobin: 7.5 g/dL — ABNORMAL LOW (ref 12.0–15.0)
MCH: 15.8 pg — ABNORMAL LOW (ref 26.0–34.0)
MCHC: 28.7 g/dL — AB (ref 30.0–36.0)
MCV: 55.1 fL — ABNORMAL LOW (ref 78.0–100.0)
Platelets: 600 10*3/uL — ABNORMAL HIGH (ref 150–400)
RBC: 4.74 MIL/uL (ref 3.87–5.11)
RDW: 20 % — AB (ref 11.5–15.5)
WBC: 7.6 10*3/uL (ref 4.0–10.5)

## 2016-05-26 NOTE — Pre-Procedure Instructions (Signed)
I sent Dr. Roselie Awkward an email about Ms. Hiraldo abnormal CBC.

## 2016-05-26 NOTE — Pre-Procedure Instructions (Signed)
Spoke with Dr. Roselie Awkward in [facility] lounge, he has been made aware of abnormal CBC no new orders received at this time.

## 2016-05-26 NOTE — Telephone Encounter (Signed)
Pt called to office stating that she had an ED visit yesterday. Pt was having severe abd and back pain.  Pt states that her pain has been worsening since last visit.  Pt states that she is scheduled for procedure next. Pt states that while in ED she was given meds for pain and nausea. Pt was also sent home with Rx for pain medication. Pt states that she was told Fibroid has changed in size since last exam. Pt states she was advised to contact GYN provider to make them aware of visit in order to review chart for any further recommendations before procedure.    Please advise if any further recommendations.

## 2016-06-01 ENCOUNTER — Encounter (HOSPITAL_COMMUNITY): Payer: Self-pay | Admitting: Anesthesiology

## 2016-06-01 NOTE — Anesthesia Preprocedure Evaluation (Addendum)
Anesthesia Evaluation  Patient identified by MRN, date of birth, ID band Patient awake    Reviewed: Allergy & Precautions, NPO status , Patient's Chart, lab work & pertinent test results  Airway Mallampati: III  TM Distance: >3 FB Neck ROM: Full    Dental no notable dental hx. (+) Teeth Intact   Pulmonary Current Smoker,    Pulmonary exam normal breath sounds clear to auscultation       Cardiovascular negative cardio ROS Normal cardiovascular exam Rhythm:Regular Rate:Normal     Neuro/Psych  Headaches, negative psych ROS   GI/Hepatic Neg liver ROS,   Endo/Other  Morbid obesity  Renal/GU Hx/o renal calculi  negative genitourinary   Musculoskeletal negative musculoskeletal ROS (+)   Abdominal (+) + obese,   Peds  Hematology  (+) anemia ,   Anesthesia Other Findings   Reproductive/Obstetrics AUB Fibroid uterus                           Anesthesia Physical Anesthesia Plan  ASA: III  Anesthesia Plan: General   Post-op Pain Management:    Induction: Intravenous and Cricoid pressure planned  Airway Management Planned: Oral ETT  Additional Equipment:   Intra-op Plan:   Post-operative Plan: Extubation in OR  Informed Consent: I have reviewed the patients History and Physical, chart, labs and discussed the procedure including the risks, benefits and alternatives for the proposed anesthesia with the patient or authorized representative who has indicated his/her understanding and acceptance.   Dental advisory given  Plan Discussed with: CRNA, Anesthesiologist and Surgeon  Anesthesia Plan Comments:       Anesthesia Quick Evaluation

## 2016-06-02 ENCOUNTER — Ambulatory Visit (HOSPITAL_COMMUNITY): Payer: Managed Care, Other (non HMO) | Admitting: Anesthesiology

## 2016-06-02 ENCOUNTER — Encounter (HOSPITAL_COMMUNITY): Payer: Self-pay | Admitting: *Deleted

## 2016-06-02 ENCOUNTER — Encounter (HOSPITAL_COMMUNITY)
Admission: RE | Disposition: A | Discharge status: Home / Self Care | Payer: Self-pay | Source: Ambulatory Visit | Attending: Obstetrics & Gynecology

## 2016-06-02 ENCOUNTER — Ambulatory Visit (HOSPITAL_COMMUNITY)
Admission: RE | Admit: 2016-06-02 | Discharge: 2016-06-02 | Disposition: A | Discharge status: Home / Self Care | Payer: Managed Care, Other (non HMO) | Source: Ambulatory Visit | Attending: Obstetrics & Gynecology | Admitting: Obstetrics & Gynecology

## 2016-06-02 DIAGNOSIS — D649 Anemia, unspecified: Secondary | ICD-10-CM | POA: Diagnosis not present

## 2016-06-02 DIAGNOSIS — D259 Leiomyoma of uterus, unspecified: Secondary | ICD-10-CM | POA: Diagnosis present

## 2016-06-02 DIAGNOSIS — Z791 Long term (current) use of non-steroidal anti-inflammatories (NSAID): Secondary | ICD-10-CM | POA: Insufficient documentation

## 2016-06-02 DIAGNOSIS — N920 Excessive and frequent menstruation with regular cycle: Secondary | ICD-10-CM | POA: Insufficient documentation

## 2016-06-02 DIAGNOSIS — Z8661 Personal history of infections of the central nervous system: Secondary | ICD-10-CM | POA: Diagnosis not present

## 2016-06-02 DIAGNOSIS — D250 Submucous leiomyoma of uterus: Secondary | ICD-10-CM | POA: Insufficient documentation

## 2016-06-02 DIAGNOSIS — N938 Other specified abnormal uterine and vaginal bleeding: Secondary | ICD-10-CM | POA: Diagnosis not present

## 2016-06-02 DIAGNOSIS — F17210 Nicotine dependence, cigarettes, uncomplicated: Secondary | ICD-10-CM | POA: Diagnosis not present

## 2016-06-02 DIAGNOSIS — Z881 Allergy status to other antibiotic agents status: Secondary | ICD-10-CM | POA: Diagnosis not present

## 2016-06-02 DIAGNOSIS — Z6841 Body Mass Index (BMI) 40.0 and over, adult: Secondary | ICD-10-CM | POA: Diagnosis not present

## 2016-06-02 HISTORY — PX: HYSTEROSCOPY WITH D & C: SHX1775

## 2016-06-02 LAB — PREGNANCY, URINE: Preg Test, Ur: NEGATIVE

## 2016-06-02 SURGERY — DILATATION AND CURETTAGE /HYSTEROSCOPY
Anesthesia: General | Site: Vagina

## 2016-06-02 MED ORDER — KETOROLAC TROMETHAMINE 30 MG/ML IJ SOLN
INTRAMUSCULAR | Status: DC | PRN
  Administered 2016-06-02: 30 mg via INTRAVENOUS

## 2016-06-02 MED ORDER — ONDANSETRON HCL 4 MG/2ML IJ SOLN
INTRAMUSCULAR | Status: DC | PRN
  Administered 2016-06-02: 4 mg via INTRAVENOUS

## 2016-06-02 MED ORDER — PANTOPRAZOLE SODIUM 40 MG PO TBEC
40.0000 mg | DELAYED_RELEASE_TABLET | Freq: Once | ORAL | Status: AC
  Administered 2016-06-02: 40 mg via ORAL

## 2016-06-02 MED ORDER — SCOPOLAMINE 1 MG/3DAYS TD PT72
1.0000 | MEDICATED_PATCH | Freq: Once | TRANSDERMAL | Status: DC
  Administered 2016-06-02: 1.5 mg via TRANSDERMAL

## 2016-06-02 MED ORDER — ROCURONIUM BROMIDE 100 MG/10ML IV SOLN
INTRAVENOUS | Status: AC
  Filled 2016-06-02: qty 1

## 2016-06-02 MED ORDER — LACTATED RINGERS IV SOLN
INTRAVENOUS | Status: DC
  Administered 2016-06-02: 07:00:00 via INTRAVENOUS

## 2016-06-02 MED ORDER — MEPERIDINE HCL 25 MG/ML IJ SOLN: 6.2500 mg | INTRAMUSCULAR | Status: DC | PRN

## 2016-06-02 MED ORDER — FENTANYL CITRATE (PF) 100 MCG/2ML IJ SOLN
INTRAMUSCULAR | Status: DC | PRN
Start: 2016-06-02 — End: 2016-06-02
  Administered 2016-06-02 (×2): 50 ug via INTRAVENOUS
  Administered 2016-06-02: 100 ug via INTRAVENOUS
  Administered 2016-06-02: 50 ug via INTRAVENOUS

## 2016-06-02 MED ORDER — MIDAZOLAM HCL 2 MG/2ML IJ SOLN
INTRAMUSCULAR | Status: AC
  Filled 2016-06-02: qty 2

## 2016-06-02 MED ORDER — SODIUM CHLORIDE 0.9 % IR SOLN
Status: DC | PRN
  Administered 2016-06-02: 3000 mL

## 2016-06-02 MED ORDER — MIDAZOLAM HCL 2 MG/2ML IJ SOLN
INTRAMUSCULAR | Status: DC | PRN
  Administered 2016-06-02: 2 mg via INTRAVENOUS

## 2016-06-02 MED ORDER — HYDROCODONE-ACETAMINOPHEN 7.5-325 MG PO TABS
ORAL_TABLET | ORAL | Status: AC
  Administered 2016-06-02: 1 via ORAL
  Filled 2016-06-02: qty 1

## 2016-06-02 MED ORDER — PROPOFOL 10 MG/ML IV BOLUS
INTRAVENOUS | Status: AC
  Filled 2016-06-02: qty 20

## 2016-06-02 MED ORDER — PANTOPRAZOLE SODIUM 40 MG PO TBEC
DELAYED_RELEASE_TABLET | ORAL | Status: AC
  Administered 2016-06-02: 40 mg via ORAL
  Filled 2016-06-02: qty 1

## 2016-06-02 MED ORDER — PROMETHAZINE HCL 25 MG/ML IJ SOLN: 6.2500 mg | INTRAMUSCULAR | Status: DC | PRN

## 2016-06-02 MED ORDER — LIDOCAINE HCL (CARDIAC) 20 MG/ML IV SOLN
INTRAVENOUS | Status: DC | PRN
  Administered 2016-06-02: 100 mg via INTRAVENOUS

## 2016-06-02 MED ORDER — LIDOCAINE HCL (CARDIAC) 20 MG/ML IV SOLN
INTRAVENOUS | Status: AC
  Filled 2016-06-02: qty 5

## 2016-06-02 MED ORDER — SUCCINYLCHOLINE CHLORIDE 20 MG/ML IJ SOLN
INTRAMUSCULAR | Status: DC | PRN
  Administered 2016-06-02: 120 mg via INTRAVENOUS

## 2016-06-02 MED ORDER — HYDROCODONE-ACETAMINOPHEN 7.5-325 MG PO TABS
1.0000 | ORAL_TABLET | Freq: Once | ORAL | Status: AC | PRN
  Administered 2016-06-02: 1 via ORAL

## 2016-06-02 MED ORDER — FENTANYL CITRATE (PF) 100 MCG/2ML IJ SOLN: 25.0000 ug | INTRAMUSCULAR | Status: DC | PRN

## 2016-06-02 MED ORDER — SCOPOLAMINE 1 MG/3DAYS TD PT72
MEDICATED_PATCH | TRANSDERMAL | Status: AC
  Administered 2016-06-02: 1.5 mg via TRANSDERMAL
  Filled 2016-06-02: qty 1

## 2016-06-02 MED ORDER — PROPOFOL 10 MG/ML IV BOLUS
INTRAVENOUS | Status: DC | PRN
  Administered 2016-06-02: 200 mg via INTRAVENOUS

## 2016-06-02 MED ORDER — DEXAMETHASONE SODIUM PHOSPHATE 10 MG/ML IJ SOLN
INTRAMUSCULAR | Status: DC | PRN
  Administered 2016-06-02: 10 mg via INTRAVENOUS

## 2016-06-02 MED ORDER — DEXAMETHASONE SODIUM PHOSPHATE 10 MG/ML IJ SOLN
INTRAMUSCULAR | Status: AC
  Filled 2016-06-02: qty 1

## 2016-06-02 MED ORDER — HYDROCODONE-ACETAMINOPHEN 7.5-325 MG PO TABS: 1.0000 | ORAL_TABLET | Freq: Once | ORAL | 0 refills | Status: DC | PRN

## 2016-06-02 MED ORDER — SUCCINYLCHOLINE CHLORIDE 200 MG/10ML IV SOSY
PREFILLED_SYRINGE | INTRAVENOUS | Status: AC
  Filled 2016-06-02: qty 10

## 2016-06-02 MED ORDER — FENTANYL CITRATE (PF) 250 MCG/5ML IJ SOLN
INTRAMUSCULAR | Status: AC
  Filled 2016-06-02: qty 5

## 2016-06-02 MED ORDER — ROCURONIUM BROMIDE 100 MG/10ML IV SOLN
INTRAVENOUS | Status: DC | PRN
  Administered 2016-06-02: 5 mg via INTRAVENOUS

## 2016-06-02 MED ORDER — ONDANSETRON HCL 4 MG/2ML IJ SOLN
INTRAMUSCULAR | Status: AC
  Filled 2016-06-02: qty 2

## 2016-06-02 MED ORDER — KETOROLAC TROMETHAMINE 30 MG/ML IJ SOLN
INTRAMUSCULAR | Status: AC
  Filled 2016-06-02: qty 1

## 2016-06-02 MED ORDER — HYDROCODONE-ACETAMINOPHEN 5-325 MG PO TABS: 1.0000 | ORAL_TABLET | Freq: Once | ORAL | Status: DC

## 2016-06-02 SURGICAL SUPPLY — 23 items
BIPOLAR CUTTING LOOP 21FR (ELECTRODE) ×1
CANISTER SUCT 3000ML PPV (MISCELLANEOUS) ×3 IMPLANT
CATH FOLEY 2WAY SLVR 30CC 16FR (CATHETERS) IMPLANT
CATH ROBINSON RED A/P 16FR (CATHETERS) ×3 IMPLANT
CLOTH BEACON ORANGE TIMEOUT ST (SAFETY) ×3 IMPLANT
CONTAINER PREFILL 10% NBF 60ML (FORM) ×6 IMPLANT
DEVICE MYOSURE LITE (MISCELLANEOUS) IMPLANT
DEVICE MYOSURE REACH (MISCELLANEOUS) IMPLANT
FILTER ARTHROSCOPY CONVERTOR (FILTER) IMPLANT
GLOVE BIO SURGEON STRL SZ7 (GLOVE) ×3 IMPLANT
GLOVE BIOGEL PI IND STRL 7.0 (GLOVE) ×4 IMPLANT
GLOVE BIOGEL PI INDICATOR 7.0 (GLOVE) ×2
GOWN STRL REUS W/TWL LRG LVL3 (GOWN DISPOSABLE) ×6 IMPLANT
LOOP CUTTING BIPOLAR 21FR (ELECTRODE) ×2 IMPLANT
PACK VAGINAL MINOR WOMEN LF (CUSTOM PROCEDURE TRAY) ×3 IMPLANT
PAD OB MATERNITY 4.3X12.25 (PERSONAL CARE ITEMS) ×3 IMPLANT
PAD PREP 24X48 CUFFED NSTRL (MISCELLANEOUS) ×3 IMPLANT
PLUG CATH AND CAP STER (CATHETERS) IMPLANT
SEAL ROD LENS SCOPE MYOSURE (ABLATOR) ×3 IMPLANT
SYR 30ML LL (SYRINGE) ×3 IMPLANT
TOWEL OR 17X24 6PK STRL BLUE (TOWEL DISPOSABLE) ×6 IMPLANT
TUBING AQUILEX INFLOW (TUBING) ×3 IMPLANT
TUBING AQUILEX OUTFLOW (TUBING) ×3 IMPLANT

## 2016-06-02 NOTE — Anesthesia Procedure Notes (Signed)
Procedure Name: Intubation Date/Time: 06/02/2016 7:26 AM Performed by: Hewitt Blade Pre-anesthesia Checklist: Patient identified, Emergency Drugs available, Suction available and Patient being monitored Patient Re-evaluated:Patient Re-evaluated prior to inductionOxygen Delivery Method: Circle system utilized Preoxygenation: Pre-oxygenation with 100% oxygen Intubation Type: IV induction, Rapid sequence and Cricoid Pressure applied Laryngoscope Size: Mac and 3 Grade View: Grade I Tube type: Oral Tube size: 7.0 mm Number of attempts: 1 Airway Equipment and Method: Stylet Placement Confirmation: ETT inserted through vocal cords under direct vision,  positive ETCO2 and breath sounds checked- equal and bilateral Secured at: 21 cm Tube secured with: Tape Dental Injury: Teeth and Oropharynx as per pre-operative assessment

## 2016-06-02 NOTE — H&P (Signed)
Patient ID: Angie Boyle, female   DOB: 1990/05/28, 26 y.o.   MRN: [id number]     Chief Complaint  Patient presents with  . Follow-up    HPI Angie Boyle is a 26 y.o. female.  G0P0000 No LMP recorded.She has only spotting now Megace prescribed since her last visit. Her HSG result was reviewed and an intracavitary fibroid was seen and a larger submucosal fibroid.  HPI      Past Medical History:  Diagnosis Date  . Meningitis   . Migraines     No past surgical history on file.       Family History  Problem Relation Age of Onset  . Fibroids Mother   . Diabetes Maternal Grandmother   . Hypertension Maternal Grandmother     Social History        Social History  Substance Use Topics  . Smoking status: Current Every Day Smoker    Types: Cigarettes  . Smokeless tobacco: Never Used  . Alcohol use Yes      Comment: Once a month         Allergies  Allergen Reactions  . Vancomycin Other (See Comments)    Red man's syndrome  . Reglan [Metoclopramide] Anxiety          Current Outpatient Prescriptions  Medication Sig Dispense Refill  . megestrol (MEGACE) 40 MG tablet Take 1 tablet (40 mg total) by mouth 2 (two) times daily. 60 tablet 2  . Multiple Vitamins-Minerals (ADULT GUMMY) CHEW Chew 2 each by mouth daily with breakfast. *Vitafusion*    . butalbital-acetaminophen-caffeine (FIORICET, ESGIC) 50-325-40 MG tablet Take 1-2 tablets by mouth every 8 (eight) hours as needed for headache. (Patient not taking: Reported on 03/03/2016) 20 tablet 0  . ibuprofen (ADVIL,MOTRIN) 200 MG tablet Take 600 mg by mouth every 4 (four) hours as needed for fever, headache, mild pain, moderate pain or cramping.      No current facility-administered medications for this visit.     Review of Systems Review of Systems  Constitutional: Negative.   Respiratory: Negative.   Genitourinary: Positive for vaginal bleeding (spotting ). Negative for pelvic pain.     Blood pressure (!) 115/57, pulse 81, temperature 99 F (37.2 C), temperature source Oral, resp. rate 18, SpO2 100 %.   Physical Exam Physical Exam  Constitutional: She appears well-developed. No distress.  Cardiovascular: Normal rate.   Psychiatric: She has a normal mood and affect. Her behavior is normal.    Data Reviewed CLINICAL DATA: Abnormal uterine bleeding. Submucosal uterine fibroids.  EXAM: [place] SONOHYSTEROGRAM  TECHNIQUE: Following cleansing of the cervix and vagina with Betadine, a hysterosalpingogram catheter was placed within the endocervical canal. Sonohysterogram was then performed with transvaginal sonography during infusion of sterile saline solution into the endometrial cavity.  COMPARISON: Pelvic ultrasound on 11/04/2015  FINDINGS: An intracavitary fibroid is seen in the inferior endometrial cavity. This is located in the right lateral wall, and measures 2.9 x 2.3 by 3.2 cm. This shows no significant change in size compared to recent exam.  A submucosal fibroid is seen in the right upper uterine corpus and fundus, which displaces the endometrial cavity to the left. This fibroid measures 6.2 x 6.1 x 5.8 cm. This shows no significant change in size compared to recent exam.  No endometrial masses or polyps are visualized.  IMPRESSION: 3.2 cm intracavitary fibroid in the lower uterine segment.  6.2 cm submucosal fibroid in the right upper uterine corpus and fundus.   Electronically Signed By:  Earle Gell M.D. On: 03/10/2016 10:18   Vitals   Height Weight BMI (Calculated)  5\' 3"  (1.6 m) 249 lb (112.9 kg) 44.4  External Result Report   External Result Report  Imaging   Imaging Information  Signed by   Signed Date/Time  Phone Pager  Earle Gell 03/10/2016 10:18 AM [phone]  Signed   Electronically signed by Earle Gell, MD on 03/10/16 at 1018 EST    CBC    Component Value Date/Time    WBC 7.6 05/26/2016 0925   RBC 4.74 05/26/2016 0925   HGB 7.5 (L) 05/26/2016 0925   HCT 26.1 (L) 05/26/2016 0925   HCT 25.0 (L) 03/03/2016 1304   PLT 600 (H) 05/26/2016 0925   PLT 281 03/03/2016 1304   MCV 55.1 (L) 05/26/2016 0925   MCV 60 (L) 03/03/2016 1304   MCH 15.8 (L) 05/26/2016 0925   MCHC 28.7 (L) 05/26/2016 0925   RDW 20.0 (H) 05/26/2016 0925   RDW 19.3 (H) 03/03/2016 1304   LYMPHSABS 2.3 05/25/2016 1921   MONOABS 0.8 05/25/2016 1921   EOSABS 0.0 05/25/2016 1921   BASOSABS 0.1 05/25/2016 1921     Assessment        Patient Active Problem List   Diagnosis Date Noted  . Fibroid uterus 03/03/2016  . Menorrhagia with regular cycle 03/03/2016   Anemia  Plan    I offered hysteroscopic myomectomy. Patient desires surgical management .  The risks of surgery were discussed in detail with the patient including but not limited to: bleeding which may require transfusion or reoperation; infection which may require prolonged hospitalization or re-hospitalization and antibiotic therapy; injury to bowel, bladder, ureters and major vessels or other surrounding organs; need for additional procedures including laparotomy; thromboembolic phenomenon, incisional problems and other postoperative or anesthesia complications.  Patient was told that the likelihood that her condition and symptoms will be treated effectively with this surgical management was very high; the postoperative expectations were also discussed in detail. The patient also understands the alternative treatment options which were discussed in full. All questions were answered.     Emeterio Reeve 06/02/2016 7:12 AM

## 2016-06-02 NOTE — Anesthesia Postprocedure Evaluation (Signed)
Anesthesia Post Note  Patient: Angie Boyle  Procedure(s) Performed: Procedure(s) (LRB): DILATATION AND CURETTAGE /HYSTEROSCOPY (N/A)  Patient location during evaluation: PACU Anesthesia Type: General Level of consciousness: awake and alert Pain management: pain level controlled Vital Signs Assessment: post-procedure vital signs reviewed and stable Respiratory status: spontaneous breathing, nonlabored ventilation, respiratory function stable and patient connected to nasal cannula oxygen Cardiovascular status: blood pressure returned to baseline and stable Postop Assessment: no signs of nausea or vomiting Anesthetic complications: no        Last Vitals:  Vitals:   06/02/16 0907 06/02/16 1000  BP:  128/80  Pulse: 79 78  Resp: 18 20  Temp: 37.2 C 37.2 C    Last Pain:  Vitals:   06/02/16 1000  TempSrc: Oral  PainSc: 2    Pain Goal: Patients Stated Pain Goal: 5 (06/02/16 1000)               Montez Hageman

## 2016-06-02 NOTE — Op Note (Signed)
PREOPERATIVE DIAGNOSIS:  Abnormal uterine bleeding, intracavitary fibroid POSTOPERATIVE DIAGNOSIS: fibroid uterus PROCEDURE: Hysteroscopy, Dilation and Curettage. SURGEON:  Dr. Emeterio Reeve   INDICATIONS: 26 y.o. G0P0000  here for scheduled surgery for the aforementioned diagnoses.   Risks of surgery were discussed with the patient including but not limited to: bleeding which may require transfusion; infection which may require antibiotics; injury to uterus or surrounding organs; intrauterine scarring which may impair future fertility; need for additional procedures including laparotomy or laparoscopy; and other postoperative/anesthesia complications. Written informed consent was obtained.    FINDINGS:  A 8-10 week size uterus.  Diffuse proliferative endometrium.  Normal ostia bilaterally.No intracavitary fibroid seen  ANESTHESIA:   General INTRAVENOUS FLUIDS:  1000 ml of LR FLUID DEFICITS:  200 ml of Glycine ESTIMATED BLOOD LOSS:  Less than 20 ml SPECIMENS: Endometrial curettings sent to pathology COMPLICATIONS:  None immediate.  PROCEDURE DETAILS:  The patient was then taken to the operating room where general anesthesia was administered and was found to be adequate.  After an adequate timeout was performed, she was placed in the dorsal lithotomy position and examined; then prepped and draped in the sterile manner.   Her bladder was catheterized for an unmeasured amount of clear, yellow urine. A speculum was then placed in the patient's vagina and a single tooth tenaculum was applied to the anterior lip of the cervix.  The cervix was sounded to 10 cm and dilated manually with metal dilators to accommodate the resectoscope.  Once the cervix was dilated, the hysteroscope was inserted under direct visualization using saline as a suspension medium.  The uterine cavity was carefully examined with the findings as noted above.   After further careful visualization of the uterine cavity, the hysteroscope  was removed under direct visualization.  A sharp curettage was then performed to obtain a moderate amount of endometrial curettings.  The tenaculum was removed from the anterior lip of the cervix and the vaginal speculum was removed after noting good hemostasis.  The patient tolerated the procedure well and was taken to the recovery area awake, extubated and in stable condition.  The patient will be discharged to home as per PACU criteria.  Routine postoperative instructions given.  She was prescribed Percocet, Ibuprofen and Colace.  She will follow up in the clinic on 2 weeks  for postoperative evaluation.   Woodroe Mode, MD 06/02/2016 8:44 AM

## 2016-06-02 NOTE — Discharge Instructions (Signed)
DISCHARGE INSTRUCTIONS: D&C / D&E The following instructions have been prepared to help you care for yourself upon your return home.   Personal hygiene:  Use sanitary pads for vaginal drainage, not tampons.  Shower the day after your procedure.  NO tub baths, pools or Jacuzzis for 2-3 weeks.  Wipe front to back after using the bathroom.  Activity and limitations:  Do NOT drive or operate any equipment for 24 hours. The effects of anesthesia are still present and drowsiness may result.  Do NOT rest in bed all day.  Walking is encouraged.  Walk up and down stairs slowly.  You may resume your normal activity in one to two days or as indicated by your physician.  Sexual activity: NO intercourse for at least 2 weeks after the procedure, or as indicated by your physician.  Diet: Eat a light meal as desired this evening. You may resume your usual diet tomorrow.  Return to work: You may resume your work activities in one to two days or as indicated by your doctor.  What to expect after your surgery: Expect to have vaginal bleeding/discharge for 2-3 days and spotting for up to 10 days. It is not unusual to have soreness for up to 1-2 weeks. You may have a slight burning sensation when you urinate for the first day. Mild cramps may continue for a couple of days. You may have a regular period in 2-6 weeks.  Call your doctor for any of the following:  Excessive vaginal bleeding, saturating and changing one pad every hour.  Inability to urinate 6 hours after discharge from hospital.  Pain not relieved by pain medication.  Fever of 100.4 F or greater.  Unusual vaginal discharge or odor.  Can take ibuprofen/motrin/ or Advil starting at 2:00PM Increase water next 48 hours    Call for an appointment:    Patients signature: ______________________  Nurses signature ________________________  Support person's signature_______________________   Hysteroscopy, Care After Refer  to this sheet in the next few weeks. These instructions provide you with information on caring for yourself after your procedure. Your health care provider may also give you more specific instructions. Your treatment has been planned according to current medical practices, but problems sometimes occur. Call your health care provider if you have any problems or questions after your procedure. What can I expect after the procedure? After your procedure, it is typical to have the following:  You may have some cramping. This normally lasts for a couple days.  You may have bleeding. This can vary from light spotting for a few days to menstrual-like bleeding for 3-7 days. Follow these instructions at home:  Rest for the first 1-2 days after the procedure.  Only take over-the-counter or prescription medicines as directed by your health care provider. Do not take aspirin. It can increase the chances of bleeding.  Take showers instead of baths for 2 weeks or as directed by your health care provider.  Do not drive for 24 hours or as directed.  Do not drink alcohol while taking pain medicine.  Do not use tampons, douche, or have sexual intercourse for 2 weeks or until your health care provider says it is okay.  Take your temperature twice a day for 4-5 days. Write it down each time.  Follow your health care provider's advice about diet, exercise, and lifting.  If you develop constipation, you may:  Take a mild laxative if your health care provider approves.  Add bran foods to your  diet.  Drink enough fluids to keep your urine clear or pale yellow.  Try to have someone with you or available to you for the first 24-48 hours, especially if you were given a general anesthetic.  Follow up with your health care provider as directed. Contact a health care provider if:  You feel dizzy or lightheaded.  You feel sick to your stomach (nauseous).  You have abnormal vaginal discharge.  You have a  rash.  You have pain that is not controlled with medicine. Get help right away if:  You have bleeding that is heavier than a normal menstrual period.  You have a fever.  You have increasing cramps or pain, not controlled with medicine.  You have new belly (abdominal) pain.  You pass out.  You have pain in the tops of your shoulders (shoulder strap areas).  You have shortness of breath. This information is not intended to replace advice given to you by your health care provider. Make sure you discuss any questions you have with your health care provider. Document Released: 10/24/2012 Document Revised: 06/11/2015 Document Reviewed: 08/02/2012 Elsevier Interactive Patient Education  2017 [employer].

## 2016-06-02 NOTE — Transfer of Care (Signed)
Immediate Anesthesia Transfer of Care Note  Patient: Angie Boyle  Procedure(s) Performed: Procedure(s) with comments: DILATATION AND CURETTAGE /HYSTEROSCOPY (N/A) - Clint Rep will be here   Patient Location: PACU  Anesthesia Type:General  Level of Consciousness: awake, alert  and oriented  Airway & Oxygen Therapy: Patient Spontanous Breathing and Patient connected to nasal cannula oxygen  Post-op Assessment: Report given to RN, Post -op Vital signs reviewed and stable and Patient moving all extremities  Post vital signs: Reviewed and stable  Last Vitals:  Vitals:   06/02/16 0634  BP: (!) 115/57  Pulse: 81  Resp: 18  Temp: 37.2 C    Last Pain:  Vitals:   06/02/16 0634  TempSrc: Oral      Patients Stated Pain Goal: 4 (46/28/63 8177)  Complications: No apparent anesthesia complications

## 2016-06-03 ENCOUNTER — Encounter (HOSPITAL_COMMUNITY): Payer: Self-pay | Admitting: Obstetrics & Gynecology

## 2016-06-20 ENCOUNTER — Encounter: Payer: Self-pay | Admitting: Obstetrics & Gynecology

## 2016-06-20 ENCOUNTER — Ambulatory Visit (INDEPENDENT_AMBULATORY_CARE_PROVIDER_SITE_OTHER): Payer: Managed Care, Other (non HMO) | Admitting: Obstetrics & Gynecology

## 2016-06-20 VITALS — BP 128/84 | HR 90 | Ht 63.0 in | Wt 241.0 lb

## 2016-06-20 DIAGNOSIS — Z9889 Other specified postprocedural states: Secondary | ICD-10-CM | POA: Diagnosis not present

## 2016-06-20 DIAGNOSIS — D250 Submucous leiomyoma of uterus: Secondary | ICD-10-CM | POA: Diagnosis not present

## 2016-06-20 DIAGNOSIS — D251 Intramural leiomyoma of uterus: Secondary | ICD-10-CM | POA: Diagnosis not present

## 2016-06-20 MED ORDER — DROSPIRENONE-ETHINYL ESTRADIOL 3-0.02 MG PO TABS: 1.0000 | ORAL_TABLET | Freq: Every day | ORAL | 11 refills | Status: AC

## 2016-06-20 NOTE — Progress Notes (Signed)
Subjective:     Angie Boyle is a 25 y.o. female who presents to the clinic 2 weeks status post diagnostic hysteroscopy and and D&C for abnormal uterine bleeding and fibroids. Eating a regular diet without difficulty. Bowel movements are normal. The patient is not having any pain.  The following portions of the patient's history were reviewed and updated as appropriate: allergies, current medications, past family history, past medical history, past social history, past surgical history and problem list.  Review of Systems vaginal spotting    Objective:    BP 128/84   Pulse 90   Ht 5\' 3"  (1.6 m)   Wt 109.3 kg (241 lb)   LMP  (LMP Unknown)   BMI 42.69 kg/m  General:  alert, cooperative and no distress  Abdomen: deferred  Incision:   none      Assessment:    Postoperative course complicated by Fibroid could not be removed hysteroscopically, management discussed, will try Yaz which shee had before Operative findings again reviewed. Pathology report discussed.    Plan:    1. Continue any current medications. 2. Wound care discussed. 3. Activity restrictions: none 4. Anticipated return to work: now. 5. Follow up: 6 months   Woodroe Mode, MD 06/20/2016

## 2016-06-20 NOTE — Progress Notes (Signed)
Pt has been spotting since surgery.

## 2016-06-20 NOTE — Patient Instructions (Signed)
Uterine Fibroids Uterine fibroids are tissue masses (tumors). They are also called leiomyomas. They can develop inside of a woman's womb (uterus). They can grow very large. Fibroids are not cancerous (benign). Most fibroids do not require medical treatment. Follow these instructions at home:  Keep all follow-up visits as told by your doctor. This is important.  Take medicines only as told by your doctor. ? If you were prescribed a hormone treatment, take the hormone medicines exactly as told. ? Do not take aspirin. It can cause bleeding.  Ask your doctor about taking iron pills and increasing the amount of dark green, leafy vegetables in your diet. These actions can help to boost your blood iron levels.  Pay close attention to your period. Tell your doctor about any changes, such as: ? Increased blood flow. This may require you to use more pads or tampons than usual per month. ? A change in the number of days that your period lasts per month. ? A change in symptoms that come with your period, such as back pain or cramping in your belly area (abdomen). Contact a doctor if:  You have pain in your back or the area between your hip bones (pelvic area) that is not controlled by medicines.  You have pain in your abdomen that is not controlled with medicines.  You have an increase in bleeding between and during periods.  You soak tampons or pads in a half hour or less.  You feel lightheaded.  You feel extra tired.  You feel weak. Get help right away if:  You pass out (faint).  You have a sudden increase in pelvic pain. This information is not intended to replace advice given to you by your health care provider. Make sure you discuss any questions you have with your health care provider. Document Released: 02/05/2010 Document Revised: 09/04/2015 Document Reviewed: 07/02/2013 Elsevier Interactive Patient Education  2018 [employer]  

## 2016-07-05 ENCOUNTER — Encounter (HOSPITAL_BASED_OUTPATIENT_CLINIC_OR_DEPARTMENT_OTHER): Payer: Self-pay | Admitting: *Deleted

## 2016-07-05 ENCOUNTER — Emergency Department (HOSPITAL_BASED_OUTPATIENT_CLINIC_OR_DEPARTMENT_OTHER)
Admission: EM | Admit: 2016-07-05 | Discharge: 2016-07-05 | Disposition: A | Discharge status: Home / Self Care | Payer: Managed Care, Other (non HMO) | Attending: Physician Assistant | Admitting: Physician Assistant

## 2016-07-05 DIAGNOSIS — Z793 Long term (current) use of hormonal contraceptives: Secondary | ICD-10-CM | POA: Insufficient documentation

## 2016-07-05 DIAGNOSIS — J029 Acute pharyngitis, unspecified: Secondary | ICD-10-CM | POA: Diagnosis present

## 2016-07-05 DIAGNOSIS — R590 Localized enlarged lymph nodes: Secondary | ICD-10-CM | POA: Insufficient documentation

## 2016-07-05 DIAGNOSIS — F17210 Nicotine dependence, cigarettes, uncomplicated: Secondary | ICD-10-CM | POA: Diagnosis not present

## 2016-07-05 DIAGNOSIS — Z79818 Long term (current) use of other agents affecting estrogen receptors and estrogen levels: Secondary | ICD-10-CM | POA: Diagnosis not present

## 2016-07-05 LAB — RAPID STREP SCREEN (MED CTR MEBANE ONLY): Streptococcus, Group A Screen (Direct): NEGATIVE

## 2016-07-05 MED ORDER — IBUPROFEN 400 MG PO TABS
600.0000 mg | ORAL_TABLET | Freq: Once | ORAL | Status: AC
  Administered 2016-07-05: 600 mg via ORAL
  Filled 2016-07-05: qty 1

## 2016-07-05 MED ORDER — DEXAMETHASONE SODIUM PHOSPHATE 10 MG/ML IJ SOLN
10.0000 mg | Freq: Once | INTRAMUSCULAR | Status: AC
  Administered 2016-07-05: 10 mg via INTRAMUSCULAR
  Filled 2016-07-05: qty 1

## 2016-07-05 MED ORDER — IBUPROFEN 200 MG PO TABS
ORAL_TABLET | ORAL | Status: AC
  Administered 2016-07-05: 17:00:00
  Filled 2016-07-05: qty 1

## 2016-07-05 NOTE — Discharge Instructions (Signed)
Use warm water salt gargles, warm teas, honey, over-the-counter lozenges and sprays for your sore throat pain. Take 600 mg of ibuprofen every 6 hours for pain and fever. Follow-up with primary care for reevaluation. Return to the ED if any concerning signs or symptoms develop.

## 2016-07-05 NOTE — ED Notes (Signed)
Patient transported to X-ray 

## 2016-07-05 NOTE — ED Triage Notes (Signed)
Sore throat and swollen lymph node in the left side of her neck x 3 days. Pressure in her left ear. She has been taking OTC cold meds. Tylenol an hour ago.

## 2016-07-05 NOTE — ED Provider Notes (Signed)
[facility] Provider Note   CSN: [id number] Arrival date & time: 07/05/16  9798  By signing my name below, I, Angie Boyle, attest that this documentation has been prepared under the direction and in the presence of [facility] PA-C.  Electronically Signed: Ephriam Boyle, ED Scribe. 07/05/16. 4:53 PM.  History   Chief Complaint No chief complaint on file.   HPI HPI Comments: Angie Boyle is a 26 y.o. female who presents to the Emergency Department complaining of constant, worsening sore throat, nasal congestion and left ear fullness that started four days ago. Pt woke up four nights ago with a sore throat. The following day she started to have ear fullness described as pressure in her left ear. She also reports associated cervical lymphadenopathy which is painful to the touch on the left side. Pt did not take her temperature at home but reports night sweats. She has a LGF of 99.33F here today. Pt has taken Tylenol, Benadryl, Theraflu, Hydrocodone with mild relief. She is able to tolerate PO's but states that she has only been eating soup for comfort. She denies any rhinorrhea, cough, chest pain, shortness of breath or headache.  The history is provided by the patient. No language interpreter was used.    Past Medical History:  Diagnosis Date  . Anemia   . Fibroids   . History of kidney stones 2011  . Meningitis   . Migraines     Patient Active Problem List   Diagnosis Date Noted  . Fibroid uterus 03/03/2016  . Menorrhagia with regular cycle 03/03/2016    Past Surgical History:  Procedure Laterality Date  . HYSTEROSCOPY W/D&C N/A 06/02/2016   Procedure: DILATATION AND CURETTAGE /HYSTEROSCOPY;  Surgeon: Woodroe Mode, MD;  Location: [facility];  Service: Gynecology;  Laterality: N/A;  Clint Rep will be here   . spinal tap      OB History    Gravida Para Term Preterm AB Living   0 0 0 0 0 0   SAB TAB Ectopic Multiple Live Births   0 0 0 0 0       Home  Medications    Prior to Admission medications   Medication Sig Start Date End Date Taking? Authorizing Provider  drospirenone-ethinyl estradiol (YAZ,GIANVI,LORYNA) 3-0.02 MG tablet Take 1 tablet by mouth daily. 06/20/16  Yes Woodroe Mode, MD  megestrol (MEGACE) 40 MG tablet Take 1 tablet (40 mg total) by mouth 2 (two) times daily. 03/23/16   Woodroe Mode, MD    Family History Family History  Problem Relation Age of Onset  . Fibroids Mother   . Diabetes Maternal Grandmother   . Hypertension Maternal Grandmother     Social History Social History  Substance Use Topics  . Smoking status: Current Every Day Smoker    Packs/day: 0.10    Years: 4.00    Types: Cigarettes, Cigars  . Smokeless tobacco: Never Used     Comment: Black and Mild  . Alcohol use Yes     Comment: Once a month     Allergies   Vancomycin and Reglan [metoclopramide]   Review of Systems Review of Systems  Constitutional: Positive for chills. Negative for fever.  HENT: Positive for congestion, ear pain ("pressure"), sore throat and trouble swallowing (due to pain). Negative for rhinorrhea.   Respiratory: Negative for shortness of breath.   Cardiovascular: Negative for chest pain.  Neurological: Negative for headaches.  All other systems reviewed and are negative.  Physical Exam Updated Vital Signs BP 128/80   Pulse 98   Temp 99.2 F (37.3 C) (Oral)   Resp 18   Ht 5\' 3"  (1.6 m)   Wt 109.3 kg (241 lb)   LMP  (LMP Unknown)   SpO2 98%   BMI 42.69 kg/m   Physical Exam  Constitutional: She is oriented to person, place, and time. She appears well-developed and well-nourished. No distress.  HENT:  Head: Normocephalic and atraumatic.  TMs normal bilaterally, no frontal or maxillary sinus TTP. Nasal septum is midline with pink mucosa and nasal congestion noted. Posterior oropharynx is erythematous with tonsillar hypertrophy, no exudates or uvular deviation noted. No trismus or sublingual  abnormalities.  Eyes: Conjunctivae are normal. Right eye exhibits no discharge. Left eye exhibits no discharge.  Neck: Normal range of motion. Neck supple. No JVD present.  Left anterior cervical LAD noted. No neck stiffness  Cardiovascular: Normal rate.   Pulmonary/Chest: Effort normal. No stridor.  Abdominal: She exhibits no distension.  Lymphadenopathy:    She has cervical adenopathy.  Neurological: She is alert and oriented to person, place, and time.  Skin: Skin is warm and dry. She is not diaphoretic.  Psychiatric: She has a normal mood and affect. Judgment normal.  Nursing note and vitals reviewed.    ED Treatments / Results  DIAGNOSTIC STUDIES: Oxygen Saturation is 98% on RA, normal by my interpretation.  COORDINATION OF CARE: 4:52 PM-Discussed treatment plan with pt at bedside and pt agreed to plan.   Labs (all labs ordered are listed, but only abnormal results are displayed) Labs Reviewed  RAPID STREP SCREEN (NOT AT [facility])  CULTURE, GROUP A STREP [facility])    EKG  EKG Interpretation None       Radiology No results found.  Procedures Procedures (including critical care time)  Medications Ordered in ED Medications  ibuprofen (ADVIL,MOTRIN) 200 MG tablet (not administered)  dexamethasone (DECADRON) injection 10 mg (10 mg Intramuscular Given 07/05/16 1654)  ibuprofen (ADVIL,MOTRIN) tablet 600 mg (600 mg Oral Given 07/05/16 1654)     Initial Impression / Assessment and Plan / ED Course  I have reviewed the triage vital signs and the nursing notes.  Pertinent labs & imaging results that were available during my care of the patient were reviewed by me and considered in my medical decision making (see chart for details).     Patient with sore throat and left anterior cervical LAD. Low-grade fever of 99.60F while in the ED. No evidence of otitis media, PTA, or Ludwig's angina. Rapid strep negative, sent for culture. Treated with NSAIDs and IM steroids while in the  ED. Airway is patent, and patient is tolerating secretions. She is tolerating PO fluid intake. Discussed supportive treatment for sore throat and congestion. She'll follow-up with primary care for reevaluation. His cousin indications for return to the ED. Pt verbalized understanding of and agreement with plan and is safe for discharge home at this time.   Final Clinical Impressions(s) / ED Diagnoses   Final diagnoses:  Sore throat  LAD (lymphadenopathy) of left cervical region    New Prescriptions New Prescriptions   No medications on file  I personally performed the services described in this documentation, which was scribed in my presence. The recorded information has been reviewed and is accurate.     Renita Papa, PA-C 07/05/16 1718    Macarthur Critchley, MD 07/05/16 [phone]

## 2016-07-08 LAB — CULTURE, GROUP A STREP (THRC)

## 2017-08-06 ENCOUNTER — Emergency Department (HOSPITAL_COMMUNITY)
Admission: EM | Admit: 2017-08-06 | Discharge: 2017-08-06 | Disposition: A | Discharge status: Home / Self Care | Payer: BLUE CROSS/BLUE SHIELD | Attending: Emergency Medicine | Admitting: Emergency Medicine

## 2017-08-06 ENCOUNTER — Encounter (HOSPITAL_COMMUNITY): Payer: Self-pay

## 2017-08-06 ENCOUNTER — Other Ambulatory Visit: Payer: Self-pay

## 2017-08-06 DIAGNOSIS — F17210 Nicotine dependence, cigarettes, uncomplicated: Secondary | ICD-10-CM | POA: Diagnosis not present

## 2017-08-06 DIAGNOSIS — H9203 Otalgia, bilateral: Secondary | ICD-10-CM | POA: Diagnosis present

## 2017-08-06 DIAGNOSIS — H66003 Acute suppurative otitis media without spontaneous rupture of ear drum, bilateral: Secondary | ICD-10-CM | POA: Insufficient documentation

## 2017-08-06 DIAGNOSIS — Z79899 Other long term (current) drug therapy: Secondary | ICD-10-CM | POA: Insufficient documentation

## 2017-08-06 MED ORDER — FLUCONAZOLE 150 MG PO TABS: ORAL_TABLET | ORAL | 1 refills | Status: DC

## 2017-08-06 MED ORDER — AMOXICILLIN 500 MG PO CAPS: 1000.0000 mg | ORAL_CAPSULE | Freq: Three times a day (TID) | ORAL | 0 refills | Status: DC

## 2017-08-06 MED ORDER — NAPROXEN 500 MG PO TABS
500.0000 mg | ORAL_TABLET | Freq: Once | ORAL | Status: AC
  Administered 2017-08-06: 500 mg via ORAL
  Filled 2017-08-06: qty 1

## 2017-08-06 MED ORDER — ONDANSETRON 8 MG PO TBDP
8.0000 mg | ORAL_TABLET | Freq: Once | ORAL | Status: AC
  Administered 2017-08-06: 8 mg via ORAL
  Filled 2017-08-06: qty 1

## 2017-08-06 MED ORDER — ONDANSETRON 8 MG PO TBDP: 8.0000 mg | ORAL_TABLET | Freq: Three times a day (TID) | ORAL | 0 refills | Status: AC | PRN

## 2017-08-06 MED ORDER — AMOXICILLIN 500 MG PO CAPS
1000.0000 mg | ORAL_CAPSULE | Freq: Once | ORAL | Status: AC
  Administered 2017-08-06: 1000 mg via ORAL
  Filled 2017-08-06: qty 2

## 2017-08-06 NOTE — ED Triage Notes (Signed)
Pt reports bilateral otalgia and muffled hearing, sore throat, and lymphedema x1 week. She states that her ears feel swollen. She has tried tylenol, OTC allergy meds, and vicks at home. She reports that she got some pain relief with tylenol. Denies N/V/D. Unknown about fevers at home, but said that she did have some chills when her symptoms were starting. A&Ox4. Ambulatory.

## 2017-08-06 NOTE — ED Provider Notes (Signed)
[facility] DEPT Provider Note: Georgena Spurling, MD, FACEP  CSN: [id number] MRN: [id number] ARRIVAL: 08/06/17 at 2237 ROOM: WA03/WA03   CHIEF COMPLAINT  Ear Pain   HISTORY OF PRESENT ILLNESS  08/06/17 11:01 PM Angie Boyle is a 27 y.o. female with a 7-day history of bilateral ear pain.  She rates her pain as a 6-7 out of 10.  Her ears feel stuffed and she is having difficulty hearing.  She is not sure if she has had a fever with this.  She has been using topical ear medications, over-the-counter, without relief.  She is also had a cough, sore throat, some nasal congestion and swollen anterior cervical lymph nodes.  She denies vomiting or diarrhea but has had some nausea.   Past Medical History:  Diagnosis Date  . Anemia   . Fibroids   . History of kidney stones 2011  . Meningitis   . Migraines     Past Surgical History:  Procedure Laterality Date  . HYSTEROSCOPY W/D&C N/A 06/02/2016   Procedure: DILATATION AND CURETTAGE /HYSTEROSCOPY;  Surgeon: Woodroe Mode, MD;  Location: [facility];  Service: Gynecology;  Laterality: N/A;  Clint Rep will be here   . spinal tap      Family History  Problem Relation Age of Onset  . Fibroids Mother   . Diabetes Maternal Grandmother   . Hypertension Maternal Grandmother     Social History   Tobacco Use  . Smoking status: Current Every Day Smoker    Packs/day: 0.10    Years: 4.00    Pack years: 0.40    Types: Cigarettes, Cigars  . Smokeless tobacco: Never Used  . Tobacco comment: Black and Mild  Substance Use Topics  . Alcohol use: Yes    Comment: Once a month  . Drug use: Yes    Types: Marijuana    Comment: daily, yesterday last time5/9/18    Prior to Admission medications   Medication Sig Start Date End Date Taking? Authorizing Provider  drospirenone-ethinyl estradiol (YAZ,GIANVI,LORYNA) 3-0.02 MG tablet Take 1 tablet by mouth daily. 06/20/16   Woodroe Mode, MD  megestrol (MEGACE) 40 MG tablet Take 1 tablet (40 mg  total) by mouth 2 (two) times daily. 03/23/16   Woodroe Mode, MD    Allergies Vancomycin and Reglan [metoclopramide]   REVIEW OF SYSTEMS  Negative except as noted here or in the History of Present Illness.   PHYSICAL EXAMINATION  Initial Vital Signs Blood pressure 135/88, pulse (!) 110, temperature 98.9 F (37.2 C), temperature source Oral, resp. rate 15, height 5\' 3"  (1.6 m), weight 104.3 kg (230 lb), SpO2 100 %.  Examination General: Well-developed, well-nourished female in no acute distress; appearance consistent with age of record HENT: normocephalic; atraumatic; bulging, erythematous TMs with purulent effusions Eyes: pupils equal, round and reactive to light; extraocular muscles intact Neck: supple; mild anterior cervical lymphadenopathy Heart: regular rate and rhythm Lungs: clear to auscultation bilaterally Abdomen: soft; nondistended; nontender; bowel sounds present Extremities: No deformity; full range of motion; pulses normal Neurologic: Awake, alert and oriented; motor function intact in all extremities and symmetric; no facial droop Skin: Warm and dry Psychiatric: Normal mood and affect   RESULTS  Summary of this visit's results, reviewed by myself:   EKG Interpretation  Date/Time:    Ventricular Rate:    PR Interval:    QRS Duration:   QT Interval:    QTC Calculation:   R Axis:     Text Interpretation:  Laboratory Studies: No results found for this or any previous visit (from the past 24 hour(s)). Imaging Studies: No results found.  ED COURSE and MDM  Nursing notes and initial vitals signs, including pulse oximetry, reviewed.  Vitals:   08/06/17 2245  BP: 135/88  Pulse: (!) 110  Resp: 15  Temp: 98.9 F (37.2 C)  TempSrc: Oral  SpO2: 100%  Weight: 104.3 kg (230 lb)  Height: 5\' 3"  (1.6 m)   Examination consistent with acute bilateral suppurative otitis media.  PROCEDURES    ED DIAGNOSES     ICD-10-CM   1. Acute suppurative  otitis media of both ears without spontaneous rupture of tympanic membranes, recurrence not specified H66.003        Glendi Mohiuddin, MD 08/06/17 2311

## 2017-10-09 ENCOUNTER — Encounter: Payer: Self-pay | Admitting: *Deleted

## 2018-04-29 ENCOUNTER — Emergency Department (HOSPITAL_COMMUNITY)
Admission: EM | Admit: 2018-04-29 | Discharge: 2018-04-29 | Disposition: A | Discharge status: Home / Self Care | Payer: PRIVATE HEALTH INSURANCE | Attending: Emergency Medicine | Admitting: Emergency Medicine

## 2018-04-29 ENCOUNTER — Encounter (HOSPITAL_COMMUNITY): Payer: Self-pay | Admitting: Obstetrics and Gynecology

## 2018-04-29 ENCOUNTER — Other Ambulatory Visit: Payer: Self-pay

## 2018-04-29 DIAGNOSIS — F17210 Nicotine dependence, cigarettes, uncomplicated: Secondary | ICD-10-CM | POA: Diagnosis not present

## 2018-04-29 DIAGNOSIS — Z79899 Other long term (current) drug therapy: Secondary | ICD-10-CM | POA: Diagnosis not present

## 2018-04-29 DIAGNOSIS — J029 Acute pharyngitis, unspecified: Secondary | ICD-10-CM | POA: Diagnosis present

## 2018-04-29 DIAGNOSIS — J020 Streptococcal pharyngitis: Secondary | ICD-10-CM | POA: Diagnosis not present

## 2018-04-29 LAB — POC URINE PREG, ED: Preg Test, Ur: NEGATIVE

## 2018-04-29 LAB — GROUP A STREP BY PCR: Group A Strep by PCR: DETECTED — AB

## 2018-04-29 MED ORDER — ACETAMINOPHEN 325 MG PO TABS
650.0000 mg | ORAL_TABLET | Freq: Once | ORAL | Status: AC
  Administered 2018-04-29: 650 mg via ORAL
  Filled 2018-04-29: qty 2

## 2018-04-29 MED ORDER — AMOXICILLIN 500 MG PO CAPS
500.0000 mg | ORAL_CAPSULE | Freq: Once | ORAL | Status: AC
  Administered 2018-04-29: 500 mg via ORAL
  Filled 2018-04-29: qty 1

## 2018-04-29 MED ORDER — LIDOCAINE VISCOUS HCL 2 % MT SOLN: 15.0000 mL | OROMUCOSAL | 0 refills | Status: AC | PRN

## 2018-04-29 MED ORDER — DEXAMETHASONE 1 MG/ML PO CONC
10.0000 mg | Freq: Once | ORAL | Status: AC
  Administered 2018-04-29: 10 mg via ORAL
  Filled 2018-04-29: qty 10

## 2018-04-29 MED ORDER — LIDOCAINE VISCOUS HCL 2 % MT SOLN
15.0000 mL | Freq: Once | OROMUCOSAL | Status: AC
  Administered 2018-04-29: 15 mL via OROMUCOSAL
  Filled 2018-04-29: qty 15

## 2018-04-29 MED ORDER — AMOXICILLIN 500 MG PO CAPS: 500.0000 mg | ORAL_CAPSULE | Freq: Two times a day (BID) | ORAL | 0 refills | Status: AC

## 2018-04-29 NOTE — ED Triage Notes (Signed)
Patient reports sore throat on the left side with pain in the left ear. PT reports right sided nasal congestion. PT denies N/V/D or fevers. PT reports pain when she eats or attempts to swallow

## 2018-04-29 NOTE — ED Notes (Signed)
Bed: WTR6 Expected date:  Expected time:  Means of arrival:  Comments: 

## 2018-04-29 NOTE — ED Notes (Signed)
ED Provider at bedside. 

## 2018-04-29 NOTE — ED Provider Notes (Signed)
[facility] DEPT Provider Note   CSN: [id number] Arrival date & time: 04/29/18  7824    History   Chief Complaint Chief Complaint  Patient presents with  . Sore Throat  . Nasal Congestion    HPI Angie Boyle is a 28 y.o. female presenting today for sore throat.  Patient reports that over the past 2 days Angie Boyle has had bilateral sore throat worse on left compared to right.  Angie Boyle states that pain radiates to both ears but again is worse on the left side.  Angie Boyle describes a moderate intensity burning sensation constant worsened with swallowing and slightly improved following ibuprofen.  Patient denies fever, cough, shortness of breath/chest pain, drooling, facial/neck swelling or any additional concerns.  Patient reports that 3-4 years ago Angie Boyle had strep throat and that this feels similar today.  Angie Boyle denies past history of peritonsillar abscess or any other complications, Angie Boyle reports that previous strep throat resolved following amoxicillin.  Additionally patient denies any recent sick contacts or known positive COVID-19 contacts.  Angie Boyle reports that Angie Boyle has been working from home over the past 3 weeks and minimizing her contact with others.     HPI  Past Medical History:  Diagnosis Date  . Anemia   . Fibroids   . History of kidney stones 2011  . Meningitis   . Migraines     Patient Active Problem List   Diagnosis Date Noted  . Fibroid uterus 03/03/2016  . Menorrhagia with regular cycle 03/03/2016    Past Surgical History:  Procedure Laterality Date  . HYSTEROSCOPY W/D&C N/A 06/02/2016   Procedure: DILATATION AND CURETTAGE /HYSTEROSCOPY;  Surgeon: Woodroe Mode, MD;  Location: [facility];  Service: Gynecology;  Laterality: N/A;  Clint Rep will be here   . ROBOTIC ASSISTED LAPAROSCOPIC VAGINAL HYSTERECTOMY WITH FIBROID REMOVAL    . spinal tap       OB History    Gravida  0   Para  0   Term  0   Preterm  0   AB  0   Living  0     SAB  0    TAB  0   Ectopic  0   Multiple  0   Live Births  0            Home Medications    Prior to Admission medications   Medication Sig Start Date End Date Taking? Authorizing Provider  amoxicillin (AMOXIL) 500 MG capsule Take 1 capsule (500 mg total) by mouth 2 (two) times daily for 10 days. 04/29/18 05/09/18  Nuala Alpha A, PA-C  drospirenone-ethinyl estradiol (YAZ,GIANVI,LORYNA) 3-0.02 MG tablet Take 1 tablet by mouth daily. 06/20/16   Woodroe Mode, MD  fluconazole (DIFLUCAN) 150 MG tablet Take 1 tablet as needed for vaginal yeast infection.  May repeat in 3 days if symptoms persist. 08/06/17   Molpus, Jenny Reichmann, MD  lidocaine (XYLOCAINE) 2 % solution Use as directed 15 mLs in the mouth or throat as needed for mouth pain (Do NOT swallow. (Swish and Spit)). 04/29/18   Deliah Boston, PA-C  megestrol (MEGACE) 40 MG tablet Take 1 tablet (40 mg total) by mouth 2 (two) times daily. 03/23/16   Woodroe Mode, MD  ondansetron (ZOFRAN ODT) 8 MG disintegrating tablet Take 1 tablet (8 mg total) by mouth every 8 (eight) hours as needed for nausea or vomiting. 08/06/17   Molpus, Jenny Reichmann, MD    Family History Family History  Problem Relation Age  of Onset  . Fibroids Mother   . Diabetes Maternal Grandmother   . Hypertension Maternal Grandmother     Social History Social History   Tobacco Use  . Smoking status: Current Every Day Smoker    Packs/day: 0.10    Years: 4.00    Pack years: 0.40    Types: Cigarettes, Cigars  . Smokeless tobacco: Never Used  . Tobacco comment: Black and Mild  Substance Use Topics  . Alcohol use: Yes    Comment: Once a month  . Drug use: Yes    Types: Marijuana    Comment: daily     Allergies   Vancomycin and Reglan [metoclopramide]   Review of Systems Review of Systems  Constitutional: Negative.  Negative for appetite change, chills and fever.  HENT: Positive for congestion, rhinorrhea and sore throat. Negative for drooling, facial swelling, trouble  swallowing and voice change.   Respiratory: Negative.  Negative for cough and shortness of breath.   Cardiovascular: Negative.  Negative for chest pain.  Gastrointestinal: Negative.  Negative for abdominal pain, diarrhea, nausea and vomiting.  Musculoskeletal: Negative.  Negative for arthralgias, myalgias and neck pain.  Skin: Negative.  Negative for rash.  Neurological: Negative.  Negative for weakness and headaches.   Physical Exam Updated Vital Signs BP (!) 122/94   Pulse 88   Temp 98 F (36.7 C) (Oral)   Resp 17   SpO2 100%   Physical Exam Constitutional:      General: Angie Boyle is not in acute distress.    Appearance: Normal appearance. Angie Boyle is well-developed. Angie Boyle is not ill-appearing or diaphoretic.  HENT:     Head: Normocephalic and atraumatic. No raccoon eyes or Battle's sign.     Jaw: There is normal jaw occlusion. No trismus.     Right Ear: Tympanic membrane, ear canal and external ear normal.     Left Ear: Tympanic membrane, ear canal and external ear normal.     Nose: Rhinorrhea present. Rhinorrhea is clear.     Right Sinus: No maxillary sinus tenderness or frontal sinus tenderness.     Left Sinus: No maxillary sinus tenderness or frontal sinus tenderness.     Mouth/Throat:     Lips: Pink.     Mouth: Mucous membranes are moist.     Pharynx: Uvula midline.     Comments: The patient has normal phonation and is in control of secretions. No stridor.  Midline uvula without edema. Soft palate rises symmetrically.  Mild bilateral tonsillar erythema, scant bilateral exudates, 1+ bilateral tonsillar swelling.  Tonsils are symmetric. Tongue protrusion is normal, floor of mouth is soft. No trismus. No creptius on neck palpation. No gingival erythema or fluctuance noted. Mucus membranes moist. Eyes:     General: Vision grossly intact. Gaze aligned appropriately.     Extraocular Movements: Extraocular movements intact.     Conjunctiva/sclera: Conjunctivae normal.     Pupils: Pupils are  equal, round, and reactive to light.  Neck:     Musculoskeletal: Full passive range of motion without pain, normal range of motion and neck supple.     Trachea: Phonation normal. No tracheal tenderness or tracheal deviation.     Meningeal: Brudzinski's sign absent.  Cardiovascular:     Rate and Rhythm: Normal rate and regular rhythm.     Heart sounds: Normal heart sounds.  Pulmonary:     Effort: Pulmonary effort is normal. No accessory muscle usage or respiratory distress.     Breath sounds: Normal breath sounds and  air entry. No stridor.  Abdominal:     General: There is no distension.     Palpations: Abdomen is soft.     Tenderness: There is no abdominal tenderness. There is no guarding or rebound.  Musculoskeletal: Normal range of motion.     Comments: Moves extremities spontaneously  Lymphadenopathy:     Cervical: Cervical adenopathy present.     Right cervical: Superficial cervical adenopathy present. No posterior cervical adenopathy.    Left cervical: Superficial cervical adenopathy present. No posterior cervical adenopathy.  Skin:    General: Skin is warm and dry.  Neurological:     Mental Status: Angie Boyle is alert.     GCS: GCS eye subscore is 4. GCS verbal subscore is 5. GCS motor subscore is 6.     Comments: Speech is clear and goal oriented, follows commands Major Cranial nerves without deficit, no facial droop Moves extremities without ataxia, coordination intact Normal gait  Psychiatric:        Behavior: Behavior normal.    ED Treatments / Results  Labs (all labs ordered are listed, but only abnormal results are displayed) Labs Reviewed  GROUP A STREP BY PCR - Abnormal; Notable for the following components:      Result Value   Group A Strep by PCR DETECTED (*)    All other components within normal limits  POC URINE PREG, ED    EKG None  Radiology No results found.  Procedures Procedures (including critical care time)  Medications Ordered in ED  Medications  acetaminophen (TYLENOL) tablet 650 mg (650 mg Oral Given 04/29/18 1049)  lidocaine (XYLOCAINE) 2 % viscous mouth solution 15 mL (15 mLs Mouth/Throat Given 04/29/18 1050)  dexamethasone (DECADRON) 1 MG/ML solution 10 mg (10 mg Oral Given 04/29/18 1122)  amoxicillin (AMOXIL) capsule 500 mg (500 mg Oral Given 04/29/18 1140)     Initial Impression / Assessment and Plan / ED Course  I have reviewed the triage vital signs and the nursing notes.  Pertinent labs & imaging results that were available during my care of the patient were reviewed by me and considered in my medical decision making (see chart for details).   LARKEN URIAS is a 28 y.o. female who presents today with a sore throat for 2 days. Airway is patient, patient reports normal phonation and clinically patient does not sound muffled. Tonsils appear symmetric and there is no uvula deviation.  Mild tonsillar erythema, scant exudate and 1+ bilateral tonsillar swelling present.  No trismus on examination. Floor of the mouth is soft/nontender and their is no abnormal tongue protrusion. Patient is tolerating their own secretions without drooling and patient is able to drink here in the emergency department and take medication without difficulty. Examination of the neck reveals mild anterior cervical lymphadenopathy without posterior lymphadenopathy. Abdominal exam reveals a soft/nontender abdomen without palpable LUQ mass or tenderness.  Strep test Positive Urine pregnancy negative  No signs of Peritonsillar Abscess, Ludwig's Angina, Retropharyngeal Abscess, Dental Abscess or other deep tissue infections of the head or neck. Additionally do not suspect mononucleosis, gonococcal pharyngitis, foreign body or epiglottitis at this time.  Discussed the importance of water rehydration with the patient.  Discussed importance of adherence to antibiotic regimen as well, and amoxicillin 500 mg twice daily x10 days prescribed.  Angie Boyle has been  given Decadron here for comfort.  Additionally viscous lidocaine mouth rinse as prescribed for comfort.  Discussed OTC anti-inflammatories.  On reexamination patient endorses improvement of pain following viscous lidocaine, Decadron  and Tylenol.  Additionally patient was evaluated in the context of the global COVID-19 pandemic, which necessitated consideration that the patient might be at risk for infection with the SARS-CoV-2 virus that causes COVID-19. Institutional protocols and algorithms that pertain to the evaluation of patients at risk for COVID-19 are in a state of rapid change based on information released by regulatory bodies including the CDC and federal and state organizations. These policies and algorithms were followed during the patient's care in the ED. patient does not meet criteria for COVID-19 testing at this time, Angie Boyle has no recent travel, known sick contacts, cough, fever or shortness of breath.  At this time there does not appear to be any evidence of an acute emergency medical condition and the patient appears stable for discharge with appropriate outpatient follow up. Diagnosis was discussed with patient who verbalizes understanding of care plan and is agreeable to discharge. I have discussed return precautions with patient who verbalizes understanding of return precautions. Patient encouraged to follow-up with their PCP. All questions answered.  Patient has been discharged in good condition.   Note: Portions of this report may have been transcribed using voice recognition software. Every effort was made to ensure accuracy; however, inadvertent computerized transcription errors may still be present. Final Clinical Impressions(s) / ED Diagnoses   Final diagnoses:  Strep pharyngitis    ED Discharge Orders         Ordered    amoxicillin (AMOXIL) 500 MG capsule  2 times daily     04/29/18 1147    lidocaine (XYLOCAINE) 2 % solution  As needed     04/29/18 1147            Gari Crown 04/29/18 1155    Lacretia Leigh, MD 04/29/18 1313

## 2018-04-29 NOTE — Discharge Instructions (Signed)
You have been diagnosed today with strep throat.  At this time there does not appear to be the presence of an emergent medical condition, however there is always the potential for conditions to change. Please read and follow the below instructions.  Please return to the Emergency Department immediately for any new or worsening symptoms or if your symptoms do not improve within 2 days. Please be sure to follow up with your Primary Care Provider within one week regarding your visit today; please call their office to schedule an appointment even if you are feeling better for a follow-up visit. Please take the antibiotic amoxicillin as prescribed, twice daily for the next 10 days.  Please drink plenty of water to avoid dehydration.  You may use over-the-counter Tylenol as directed on the packaging to help with pain.  You may use the viscous lidocaine mouth rinse prescribed to you today to help with your pain as well, do not swallow this medication.  Please get plenty of rest and follow-up with your primary care provider this week.  Return to the emergency department immediately for any new or worsening symptoms.  Get help right away if: You throw up (vomit). You get a very bad headache. You neck hurts or it feels stiff. You have chest pain or you are short of breath. You have drooling, very bad throat pain, or changes in your voice. Your neck is swollen or the skin gets red and tender. Your mouth is dry or you are peeing less than normal. You keep feeling more tired or it is hard to wake up. Your joints are red or they hurt. You are unable to swallow You have trouble breathing Any new/concerning or worsening symptoms  Please read the additional information packets attached to your discharge summary.  Do not take your medicine if  develop an itchy rash, swelling in your mouth or lips, or difficulty breathing.

## 2018-07-17 IMAGING — CT CT RENAL STONE PROTOCOL
2 of 4 series · 17 of 46 positions shown, 19 images · non-contrast
Comparison: 11/12/2009

CLINICAL DATA: Acute onset left flank pain today.  Nephrolithiasis.

EXAM:
CT ABDOMEN AND PELVIS WITHOUT CONTRAST
TECHNIQUE: Multidetector CT imaging of the abdomen and pelvis was performed
following the standard protocol without IV contrast.

[Series 2: axial st · axial · 0.98mm/px · z∈[-526,-86]mm · 14 of 96 slices shown, 16 images]
[im 4/96  soft-tissue]
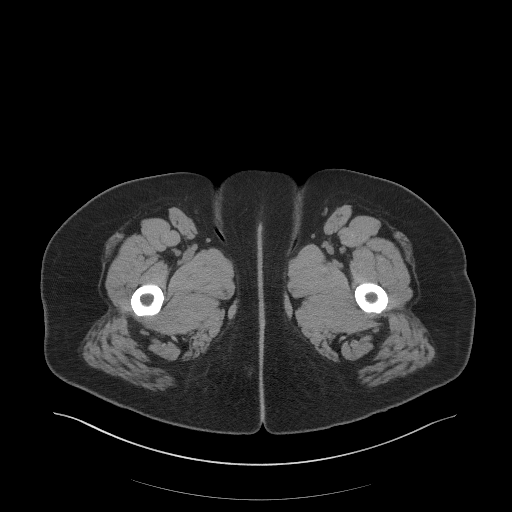
[im 4/96  bone]
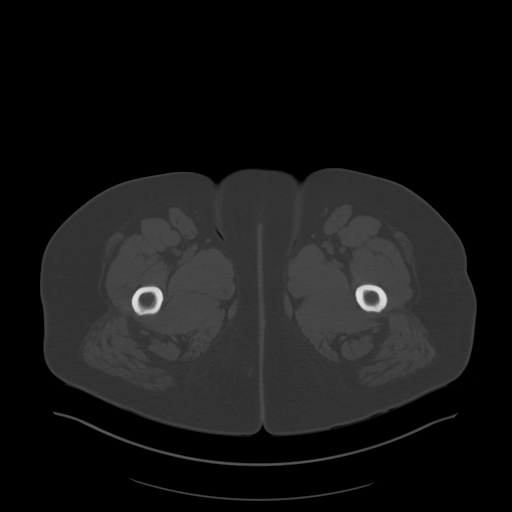
[im 12/96  soft-tissue]
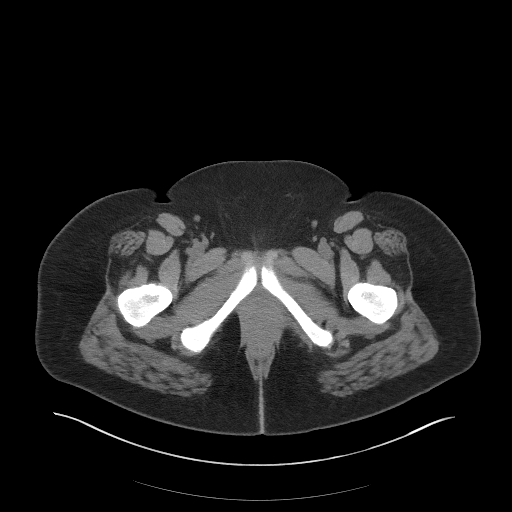
[im 20/96  soft-tissue]
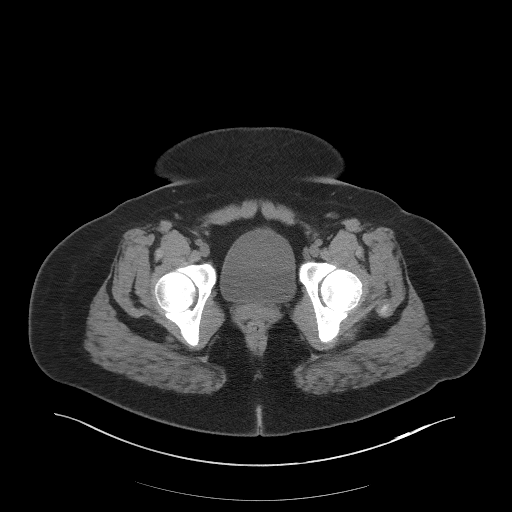
[im 27/96  soft-tissue]
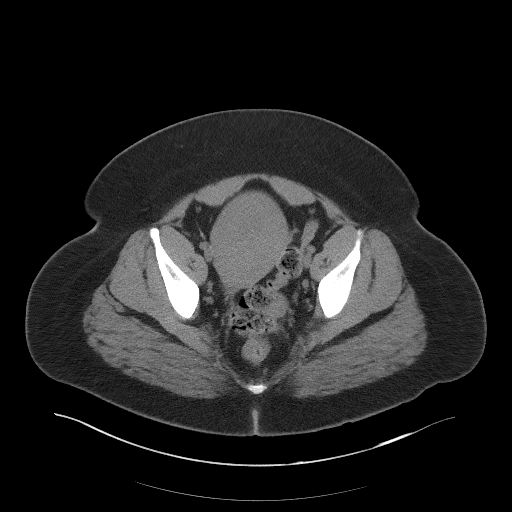
[im 31/96  soft-tissue]
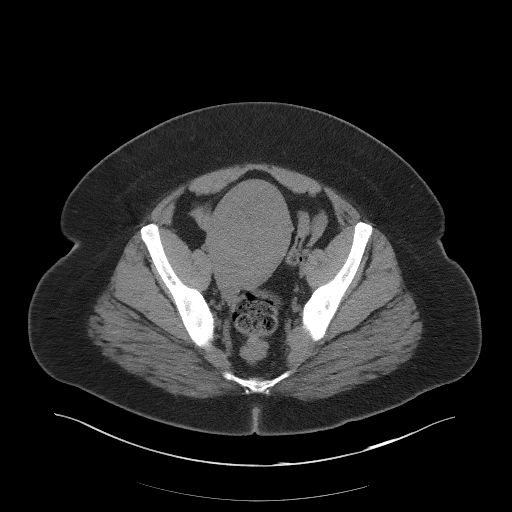
[im 39/96  soft-tissue]
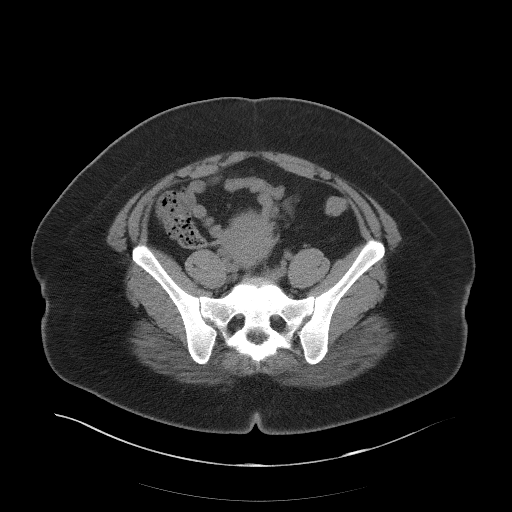
[im 46/96  soft-tissue]
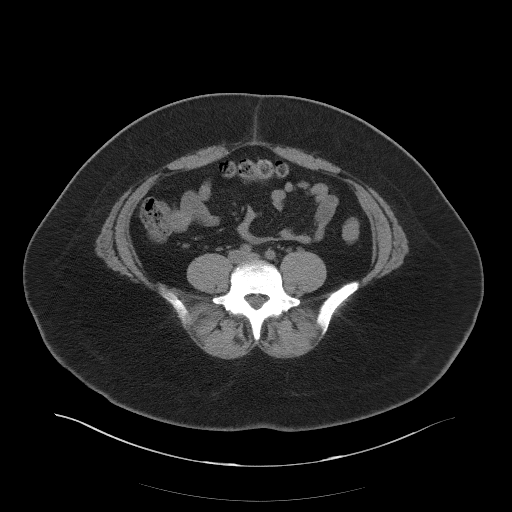
[im 50/96  soft-tissue]
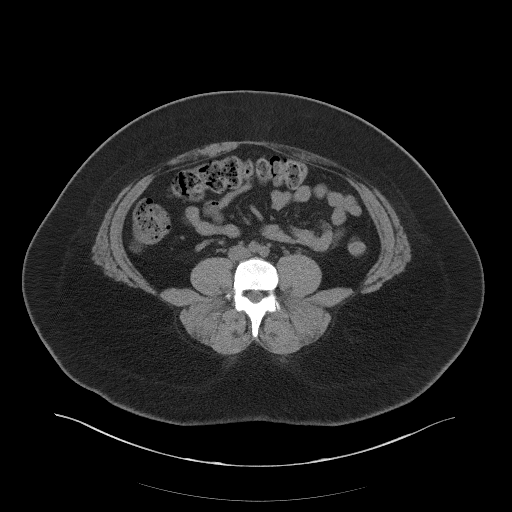
[im 58/96  soft-tissue]
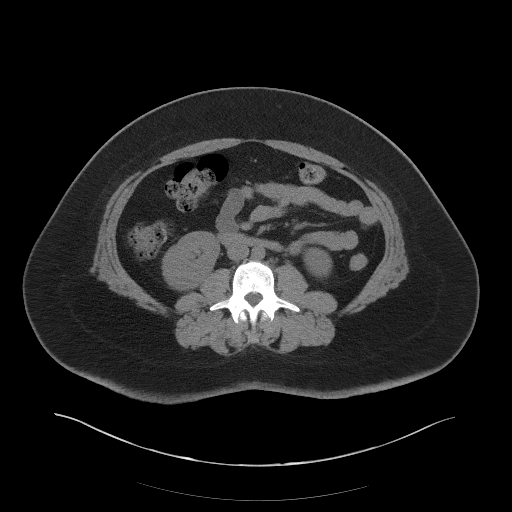
[im 58/96  bone]
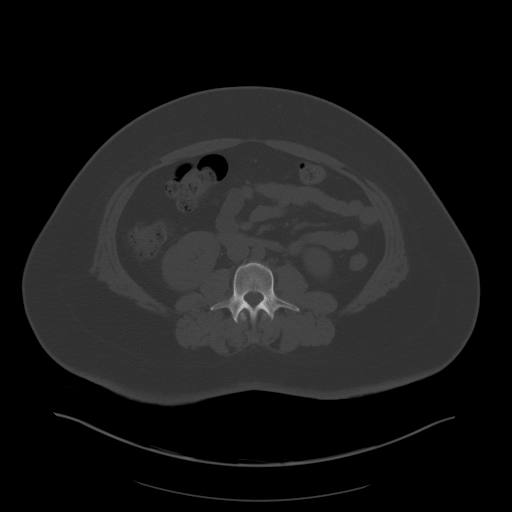
[im 65/96  soft-tissue]
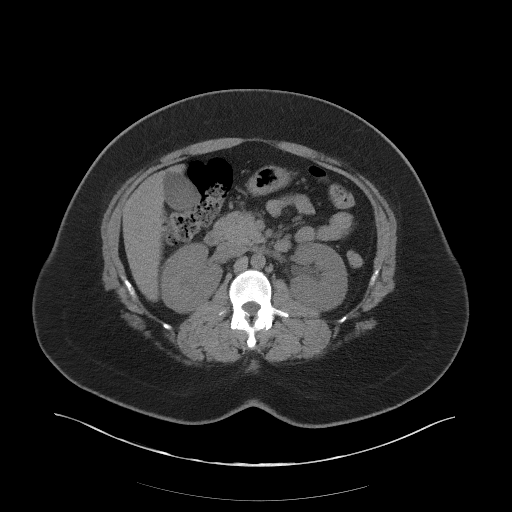
[im 73/96  soft-tissue]
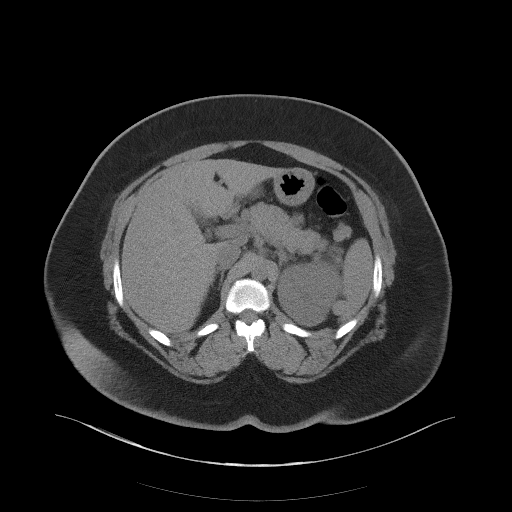
[im 77/96  soft-tissue]
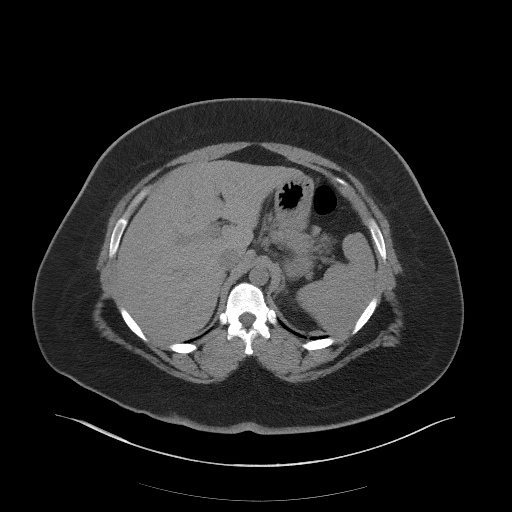
[im 84/96  soft-tissue]
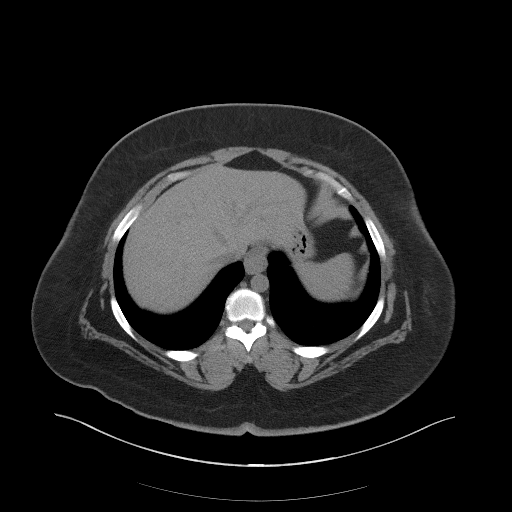
[im 92/96  soft-tissue]
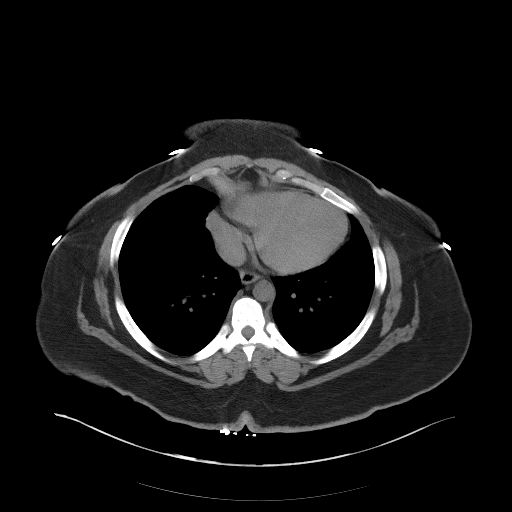

[Series 5: coronal st · coronal · 0.96mm/px · 3 of 98 slices shown]
[im 33/98  soft-tissue]
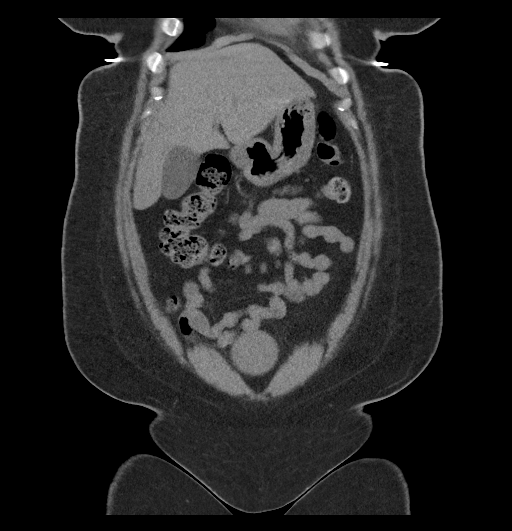
[im 44/98  soft-tissue]
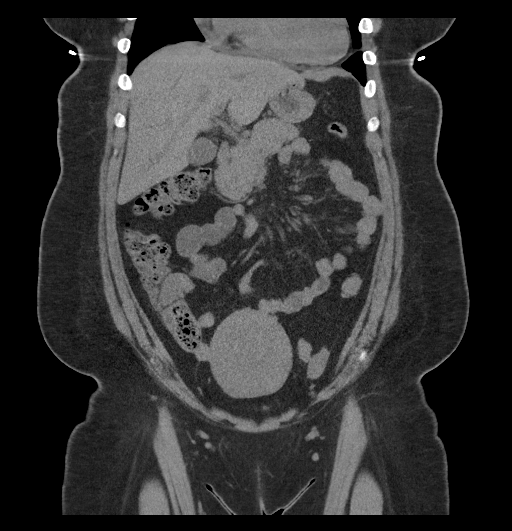
[im 54/98  soft-tissue]
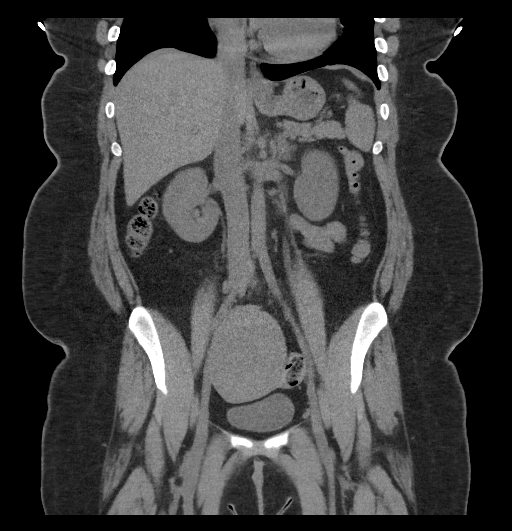

[17 of 46 positions shown; findings below may reference images not displayed]

FINDINGS: Lower chest: No acute findings.

Hepatobiliary: No masses visualized on this unenhanced exam.
Gallbladder is unremarkable.

Pancreas: No mass or inflammatory process visualized on this
unenhanced exam.

Spleen:  Within normal limits in size.

Adrenals/Urinary tract: No evidence of urolithiasis or
hydronephrosis. Unremarkable unopacified urinary bladder.

Stomach/Bowel: No evidence of obstruction, inflammatory process, or
abnormal fluid collections.

Vascular/Lymphatic: No pathologically enlarged lymph nodes
identified. No evidence of abdominal aortic aneurysm.

Reproductive: Approximately 8 cm uterine fibroid seen within the
central pelvis. Adnexal regions are unremarkable.

Other:  None.

Musculoskeletal:  No suspicious bone lesions identified.
IMPRESSION: No evidence of urolithiasis, hydronephrosis, or other acute
findings.

Large uterine fibroid measuring approximately 8 cm.

## 2019-06-08 ENCOUNTER — Other Ambulatory Visit: Payer: Self-pay

## 2019-06-08 ENCOUNTER — Encounter (HOSPITAL_COMMUNITY): Payer: Self-pay | Admitting: Emergency Medicine

## 2019-06-08 ENCOUNTER — Emergency Department (HOSPITAL_COMMUNITY)
Admission: EM | Admit: 2019-06-08 | Discharge: 2019-06-09 | Disposition: A | Discharge status: Home / Self Care | Payer: 59 | Attending: Emergency Medicine | Admitting: Emergency Medicine

## 2019-06-08 DIAGNOSIS — Z79899 Other long term (current) drug therapy: Secondary | ICD-10-CM | POA: Insufficient documentation

## 2019-06-08 DIAGNOSIS — F17210 Nicotine dependence, cigarettes, uncomplicated: Secondary | ICD-10-CM | POA: Insufficient documentation

## 2019-06-08 DIAGNOSIS — Z20822 Contact with and (suspected) exposure to covid-19: Secondary | ICD-10-CM | POA: Insufficient documentation

## 2019-06-08 DIAGNOSIS — J029 Acute pharyngitis, unspecified: Secondary | ICD-10-CM | POA: Diagnosis present

## 2019-06-08 DIAGNOSIS — J020 Streptococcal pharyngitis: Secondary | ICD-10-CM | POA: Diagnosis not present

## 2019-06-08 LAB — SARS CORONAVIRUS 2 BY RT PCR (HOSPITAL ORDER, PERFORMED IN ~~LOC~~ HOSPITAL LAB): SARS Coronavirus 2: NEGATIVE

## 2019-06-08 LAB — GROUP A STREP BY PCR: Group A Strep by PCR: DETECTED — AB

## 2019-06-08 MED ORDER — IBUPROFEN 800 MG PO TABS
800.0000 mg | ORAL_TABLET | Freq: Once | ORAL | Status: AC
  Administered 2019-06-08: 800 mg via ORAL
  Filled 2019-06-08: qty 1

## 2019-06-08 MED ORDER — PENICILLIN G BENZATHINE 1200000 UNIT/2ML IM SUSP
1.2000 10*6.[IU] | Freq: Once | INTRAMUSCULAR | Status: AC
  Administered 2019-06-08: 1.2 10*6.[IU] via INTRAMUSCULAR
  Filled 2019-06-08: qty 2

## 2019-06-08 NOTE — ED Provider Notes (Signed)
[facility] DEPT Provider Note   CSN: [id number] Arrival date & time: 06/08/19  2042     History Chief Complaint  Patient presents with  . Sore Throat  . Nausea  . Fatigue  . Neck Pain    Alyah L Hofland is a 29 y.o. female hx of anemia, here presenting with sore throat, congestion.  Patient states that she started having sore throat this morning.  She states that she has pain when she swallows.  She also felt that her neck was swollen.  She has some sinus congestion as well.  She states that previously she had strep throat before and was concerned for possible strep throat.  Patient denies any neck stiffness.  Patient denies any exposure for Covid and did not get the vaccine yet.  Denies being pregnant currently.  The history is provided by the patient.       Past Medical History:  Diagnosis Date  . Anemia   . Fibroids   . History of kidney stones 2011  . Meningitis   . Migraines     Patient Active Problem List   Diagnosis Date Noted  . Fibroid uterus 03/03/2016  . Menorrhagia with regular cycle 03/03/2016    Past Surgical History:  Procedure Laterality Date  . HYSTEROSCOPY WITH D & C N/A 06/02/2016   Procedure: DILATATION AND CURETTAGE /HYSTEROSCOPY;  Surgeon: Woodroe Mode, MD;  Location: [facility];  Service: Gynecology;  Laterality: N/A;  Clint Rep will be here   . ROBOTIC ASSISTED LAPAROSCOPIC VAGINAL HYSTERECTOMY WITH FIBROID REMOVAL    . spinal tap       OB History    Gravida  0   Para  0   Term  0   Preterm  0   AB  0   Living  0     SAB  0   TAB  0   Ectopic  0   Multiple  0   Live Births  0           Family History  Problem Relation Age of Onset  . Fibroids Mother   . Diabetes Maternal Grandmother   . Hypertension Maternal Grandmother     Social History   Tobacco Use  . Smoking status: Current Every Day Smoker    Packs/day: 0.10    Years: 4.00    Pack years: 0.40    Types: Cigarettes,  Cigars  . Smokeless tobacco: Never Used  . Tobacco comment: Black and Mild  Substance Use Topics  . Alcohol use: Yes    Comment: Once a month  . Drug use: Yes    Types: Marijuana    Comment: daily    Home Medications Prior to Admission medications   Medication Sig Start Date End Date Taking? Authorizing Provider  drospirenone-ethinyl estradiol (YAZ,GIANVI,LORYNA) 3-0.02 MG tablet Take 1 tablet by mouth daily. 06/20/16   Woodroe Mode, MD  fluconazole (DIFLUCAN) 150 MG tablet Take 1 tablet as needed for vaginal yeast infection.  May repeat in 3 days if symptoms persist. 08/06/17   Molpus, Jenny Reichmann, MD  lidocaine (XYLOCAINE) 2 % solution Use as directed 15 mLs in the mouth or throat as needed for mouth pain (Do NOT swallow. (Swish and Spit)). 04/29/18   Deliah Boston, PA-C  megestrol (MEGACE) 40 MG tablet Take 1 tablet (40 mg total) by mouth 2 (two) times daily. 03/23/16   Woodroe Mode, MD  ondansetron (ZOFRAN ODT) 8 MG disintegrating tablet Take 1  tablet (8 mg total) by mouth every 8 (eight) hours as needed for nausea or vomiting. 08/06/17   Molpus, Jenny Reichmann, MD    Allergies    Vancomycin and Reglan [metoclopramide]  Review of Systems   Review of Systems  HENT: Positive for sore throat.   Musculoskeletal: Positive for neck pain.  All other systems reviewed and are negative.   Physical Exam Updated Vital Signs BP 128/84   Pulse 80   Temp 100.1 F (37.8 C) (Oral)   Resp 20   Ht 5\' 3"  (1.6 m)   Wt 113.4 kg   SpO2 100%   BMI 44.29 kg/m   Physical Exam Vitals and nursing note reviewed.  HENT:     Head: Normocephalic.     Right Ear: Tympanic membrane normal.     Left Ear: Tympanic membrane normal.     Mouth/Throat:     Tonsils: No tonsillar exudate.     Comments: OP slightly red, tonsils swollen with no exudates, uvula is midline. Eyes:     Conjunctiva/sclera: Conjunctivae normal.  Neck:     Comments: No meningeal signs but there is right cervical  lymphadenopathy. Cardiovascular:     Rate and Rhythm: Normal rate and regular rhythm.  Pulmonary:     Effort: Pulmonary effort is normal.     Breath sounds: Normal breath sounds.  Abdominal:     General: Bowel sounds are normal.     Palpations: Abdomen is soft.  Musculoskeletal:     Cervical back: Normal range of motion.  Skin:    General: Skin is warm.     Capillary Refill: Capillary refill takes less than 2 seconds.  Neurological:     General: No focal deficit present.     Mental Status: She is alert and oriented to person, place, and time.  Psychiatric:        Mood and Affect: Mood normal.        Behavior: Behavior normal.     ED Results / Procedures / Treatments   Labs (all labs ordered are listed, but only abnormal results are displayed) Labs Reviewed  SARS CORONAVIRUS 2 BY RT PCR (HOSPITAL ORDER, [facility])  GROUP A STREP BY PCR    EKG None  Radiology No results found.  Procedures Procedures (including critical care time)  Medications Ordered in ED Medications  ibuprofen (ADVIL) tablet 800 mg (800 mg Oral Given 06/08/19 2138)    ED Course  I have reviewed the triage vital signs and the nursing notes.  Pertinent labs & imaging results that were available during my care of the patient were reviewed by me and considered in my medical decision making (see chart for details).    MDM Rules/Calculators/A&P                      GEETA SARTORI is a 29 y.o. female who presented with sore throat.  Consider strep versus Covid.  Patient strep test is positive.  Given Bicillin shot.  Her Covid is negative.  Stable for discharge.  Final Clinical Impression(s) / ED Diagnoses Final diagnoses:  None    Rx / DC Orders ED Discharge Orders    None       Drenda Freeze, MD 06/08/19 2338

## 2019-06-08 NOTE — Discharge Instructions (Signed)
You have strep throat.  Take Tylenol and Motrin for pain or fever.  You are given Bicillin shot to treat strep.  Stay hydrated.  Return to ER if you have worse sore throat, fever, trouble breathing or trouble swallowing.

## 2019-06-08 NOTE — ED Notes (Signed)
Patient would also like a covid test.

## 2019-06-08 NOTE — ED Triage Notes (Signed)
Patient present with a sore throat, nausea and fatigue. She report pain with rotating her neck to the right. She also reports swollen lymph node. The patient believes she may have strep, which she gets every year.

## 2019-06-09 NOTE — ED Notes (Signed)
Pt declined vitals recheck at discharge.

## 2019-06-12 ENCOUNTER — Other Ambulatory Visit: Payer: Self-pay

## 2019-06-12 ENCOUNTER — Ambulatory Visit (HOSPITAL_COMMUNITY)
Admission: EM | Admit: 2019-06-12 | Discharge: 2019-06-12 | Discharge status: Against Medical Advice | Payer: PRIVATE HEALTH INSURANCE

## 2019-06-12 ENCOUNTER — Encounter (HOSPITAL_COMMUNITY): Payer: Self-pay

## 2019-06-12 ENCOUNTER — Emergency Department (HOSPITAL_COMMUNITY): Payer: 59

## 2019-06-12 ENCOUNTER — Emergency Department (HOSPITAL_COMMUNITY)
Admission: EM | Admit: 2019-06-12 | Discharge: 2019-06-12 | Disposition: A | Discharge status: Home / Self Care | Payer: 59 | Attending: Emergency Medicine | Admitting: Emergency Medicine

## 2019-06-12 DIAGNOSIS — R202 Paresthesia of skin: Secondary | ICD-10-CM | POA: Insufficient documentation

## 2019-06-12 DIAGNOSIS — F17210 Nicotine dependence, cigarettes, uncomplicated: Secondary | ICD-10-CM | POA: Diagnosis not present

## 2019-06-12 DIAGNOSIS — I10 Essential (primary) hypertension: Secondary | ICD-10-CM

## 2019-06-12 DIAGNOSIS — R200 Anesthesia of skin: Secondary | ICD-10-CM | POA: Insufficient documentation

## 2019-06-12 DIAGNOSIS — Z79899 Other long term (current) drug therapy: Secondary | ICD-10-CM | POA: Diagnosis not present

## 2019-06-12 DIAGNOSIS — M79601 Pain in right arm: Secondary | ICD-10-CM

## 2019-06-12 DIAGNOSIS — R0602 Shortness of breath: Secondary | ICD-10-CM | POA: Diagnosis present

## 2019-06-12 LAB — CBG MONITORING, ED: Glucose-Capillary: 71 mg/dL (ref 70–99)

## 2019-06-12 MED ORDER — IBUPROFEN 800 MG PO TABS
800.0000 mg | ORAL_TABLET | Freq: Once | ORAL | Status: AC
  Administered 2019-06-12: 800 mg via ORAL
  Filled 2019-06-12: qty 1

## 2019-06-12 NOTE — ED Provider Notes (Signed)
[place] DEPT Provider Note   CSN: [id number] Arrival date & time: 06/12/19  1140     History Chief Complaint  Patient presents with  . Shortness of Breath  . Extremity numbness     Angie Boyle is a 29 y.o. female.  HPI 29 year old female presents today complaining of bilateral forearm paresthesias that occur in the right arm and then in the left arm rotating back-and-forth.  She has noted some increased pain with flexion extension of her wrist.  The paresthesias shoot up the arm on the left side.  She has had some pain shooting up into the left side of her neck.  She has also had some numbness of her great toe on the left side.  She denies any definite lateralized symptoms.  She was concerned guarding her heart and she has a known family history of heart disease.  She has also previously been diagnosed with prehypertension by her primary care physicians at [facility] and is being followed for this.  She was recently seen in the ED here for strep throat and had a positive strep test.  She received IM antibiotics and her symptoms from this are improving.  She reports that she has previously been checked for diabetes but has never been told that she had high blood sugar.  She is a prior smoker but quit as of May 1 and has been attempting to watch her diet as well as exercising, although she had some difficulty through the episode of strep.  She describes current weakness in her left fingers and forearm.  The symptoms have been waxing and waning over the past several months.  She reports that when she first got here she was quite anxious and distressed but feels that the symptoms have somewhat improved here while waiting for evaluation.    Past Medical History:  Diagnosis Date  . Anemia   . Fibroids   . History of kidney stones 2011  . Meningitis   . Migraines     Patient Active Problem List   Diagnosis Date Noted  . Fibroid uterus 03/03/2016  .  Menorrhagia with regular cycle 03/03/2016    Past Surgical History:  Procedure Laterality Date  . HYSTEROSCOPY WITH D & C N/A 06/02/2016   Procedure: DILATATION AND CURETTAGE /HYSTEROSCOPY;  Surgeon: Woodroe Mode, MD;  Location: [facility];  Service: Gynecology;  Laterality: N/A;  Clint Rep will be here   . ROBOTIC ASSISTED LAPAROSCOPIC VAGINAL HYSTERECTOMY WITH FIBROID REMOVAL    . spinal tap       OB History    Gravida  0   Para  0   Term  0   Preterm  0   AB  0   Living  0     SAB  0   TAB  0   Ectopic  0   Multiple  0   Live Births  0           Family History  Problem Relation Age of Onset  . Fibroids Mother   . Diabetes Maternal Grandmother   . Hypertension Maternal Grandmother     Social History   Tobacco Use  . Smoking status: Current Every Day Smoker    Packs/day: 0.10    Years: 4.00    Pack years: 0.40    Types: Cigarettes, Cigars  . Smokeless tobacco: Never Used  . Tobacco comment: Black and Mild  Substance Use Topics  . Alcohol use: Yes  Comment: Once a month  . Drug use: Yes    Types: Marijuana    Comment: daily    Home Medications Prior to Admission medications   Medication Sig Start Date End Date Taking? Authorizing Provider  drospirenone-ethinyl estradiol (YAZ,GIANVI,LORYNA) 3-0.02 MG tablet Take 1 tablet by mouth daily. 06/20/16   Woodroe Mode, MD  fluconazole (DIFLUCAN) 150 MG tablet Take 1 tablet as needed for vaginal yeast infection.  May repeat in 3 days if symptoms persist. 08/06/17   Molpus, Jenny Reichmann, MD  lidocaine (XYLOCAINE) 2 % solution Use as directed 15 mLs in the mouth or throat as needed for mouth pain (Do NOT swallow. (Swish and Spit)). 04/29/18   Deliah Boston, PA-C  megestrol (MEGACE) 40 MG tablet Take 1 tablet (40 mg total) by mouth 2 (two) times daily. 03/23/16   Woodroe Mode, MD  ondansetron (ZOFRAN ODT) 8 MG disintegrating tablet Take 1 tablet (8 mg total) by mouth every 8 (eight) hours as needed for nausea  or vomiting. 08/06/17   Molpus, Jenny Reichmann, MD    Allergies    Vancomycin and Reglan [metoclopramide]  Review of Systems   Review of Systems  All other systems reviewed and are negative.   Physical Exam Updated Vital Signs BP (!) 144/85 (BP Location: Right Arm)   Pulse 67   Temp 98.8 F (37.1 C) (Oral)   Resp 18   LMP 05/31/2019 (Approximate)   SpO2 100%   Physical Exam Vitals and nursing note reviewed.  Constitutional:      General: She is not in acute distress.    Appearance: She is well-developed. She is obese. She is not ill-appearing.  HENT:     Head: Normocephalic and atraumatic.     Mouth/Throat:     Mouth: Mucous membranes are moist.  Eyes:     Pupils: Pupils are equal, round, and reactive to light.  Cardiovascular:     Rate and Rhythm: Normal rate and regular rhythm.  Pulmonary:     Effort: Pulmonary effort is normal.     Breath sounds: Normal breath sounds.  Chest:     Chest wall: No mass.  Abdominal:     General: Bowel sounds are normal.     Palpations: Abdomen is soft.  Musculoskeletal:        General: Normal range of motion.     Cervical back: Normal range of motion.  Skin:    General: Skin is warm and dry.     Capillary Refill: Capillary refill takes less than 2 seconds.  Neurological:     General: No focal deficit present.     Mental Status: She is alert.     Cranial Nerves: No cranial nerve deficit.     Motor: No weakness.     Comments: Intrinsic hand muscles appear equal in bilateral hands Wrist flexion extension, elbow flexion extension, and shoulders appear 5 out of 5 and equal bilaterally. Sensation is intact.  Psychiatric:        Mood and Affect: Mood normal.     ED Results / Procedures / Treatments   Labs (all labs ordered are listed, but only abnormal results are displayed) Labs Reviewed  CBG MONITORING, ED    EKG EKG Interpretation  Date/Time:  Wednesday Jun 12 2019 12:05:37 EDT Ventricular Rate:  61 PR Interval:    QRS  Duration: 89 QT Interval:  395 QTC Calculation: 398 R Axis:   76 Text Interpretation: Normal sinus rhythm Normal ECG Confirmed by Pattricia Boss [phone])  on 06/12/2019 4:28:34 PM   Radiology DG Chest 2 View  Result Date: 06/12/2019 CLINICAL DATA:  Shortness of breath EXAM: CHEST - 2 VIEW COMPARISON:  November 04, 2015. FINDINGS: Lungs are clear. Heart size and pulmonary vascularity are normal. No adenopathy. No bone lesions. IMPRESSION: Lungs clear.  Cardiac silhouette within normal limits. Electronically Signed   By: Lowella Grip III M.D.   On: 06/12/2019 12:58    Procedures Procedures (including critical care time)  Medications Ordered in ED Medications - No data to display  ED Course  I have reviewed the triage vital signs and the nursing notes.  Pertinent labs & imaging results that were available during my care of the patient were reviewed by me and considered in my medical decision making (see chart for details). Patient does work at a computer and symptoms could be consistent with carpal tunnel syndrome.  I did check her blood sugar here.  It is normal.  Her blood pressure was elevated at 144/85, but this could be due to her natural physiological response to stress here in the ED.  She is [facility] at [facility] for this. Patient with bilateral symptoms and I have low index of suspicion for stroke or other acute intracranial abnormalities. EKG obtained here appears normal. Plan splint for possible carpal tunnel syndrome.  Discussed use of ibuprofen and anti-inflammatories for this condition. We discussed risk factor modification including ongoing weight loss and exercise.  We also discussed need for close follow-up with her primary care physician.   MDM Rules/Calculators/A&P                      Final Clinical Impression(s) / ED Diagnoses Final diagnoses:  Paresthesia and pain of both upper extremities  Hypertension, unspecified type    Rx / DC Orders ED Discharge Orders     None       Pattricia Boss, MD 06/12/19 [phone]

## 2019-06-12 NOTE — ED Triage Notes (Signed)
Pt presents with c/o shortness of breath that started today and numbness in both arms that has been present for approx 2 months. Pt reports her arms are always numb but that the numbness will travel between her right and left arm. Pt also reports recent tx for strep throat and associated vomiting and diarrhea after taking the antibiotics.

## 2019-06-12 NOTE — Discharge Instructions (Addendum)
Please use left wrist splint for pain Continue ibuprofen 600 mg up to 3 times a day for 1 week Call your primary care doctor today for recheck in 1 to 2 weeks Your EKG and blood sugar are normal here Your blood pressure is slightly elevated but may be due to the situational stress of being in the emergency department. Please return to the emergency department if you are worse at any time.

## 2019-06-12 NOTE — ED Notes (Signed)
Pt provided with apple juice

## 2019-06-12 NOTE — ED Notes (Signed)
Pt verbalizes understanding of DC instructions. Pt belongings returned and is ambulatory out of ED.  

## 2019-10-03 ENCOUNTER — Ambulatory Visit: Payer: 59

## 2019-10-03 ENCOUNTER — Ambulatory Visit
Admission: EM | Admit: 2019-10-03 | Discharge: 2019-10-03 | Disposition: A | Discharge status: Home / Self Care | Payer: 59 | Attending: Family Medicine | Admitting: Family Medicine

## 2019-10-03 ENCOUNTER — Other Ambulatory Visit: Payer: Self-pay

## 2019-10-03 DIAGNOSIS — Z1152 Encounter for screening for COVID-19: Secondary | ICD-10-CM | POA: Diagnosis not present

## 2019-10-03 DIAGNOSIS — J020 Streptococcal pharyngitis: Secondary | ICD-10-CM | POA: Diagnosis not present

## 2019-10-03 LAB — POCT RAPID STREP A (OFFICE): Rapid Strep A Screen: POSITIVE — AB

## 2019-10-03 MED ORDER — AMOXICILLIN 500 MG PO TABS: 500.0000 mg | ORAL_TABLET | Freq: Two times a day (BID) | ORAL | 0 refills | Status: DC

## 2019-10-03 MED ORDER — IBUPROFEN 800 MG PO TABS: 800.0000 mg | ORAL_TABLET | Freq: Three times a day (TID) | ORAL | 0 refills | Status: AC | PRN

## 2019-10-03 MED ORDER — FLUCONAZOLE 200 MG PO TABS: 200.0000 mg | ORAL_TABLET | Freq: Once | ORAL | 0 refills | Status: AC

## 2019-10-03 MED ORDER — AMOXICILLIN 875 MG PO TABS: 875.0000 mg | ORAL_TABLET | Freq: Two times a day (BID) | ORAL | 0 refills | Status: AC

## 2019-10-03 NOTE — ED Triage Notes (Signed)
Pt states that she has had sore throat, right ear pain, and right neck pain since yesterday. Pt is aox4 and ambulatory.

## 2019-10-03 NOTE — Discharge Instructions (Addendum)
Your strep test was positive. I have sent in amoxicillin for you to take twice a day for 10 days.  Your COVID test is pending.  You should self quarantine until the test result is back.    Take Tylenol as needed for fever or discomfort.  Rest and keep yourself hydrated.    Go to the emergency department if you develop acute worsening symptoms.

## 2019-10-03 NOTE — ED Provider Notes (Signed)
[facility]   [id number] 10/03/19 Arrival Time: 4825  OI:[facility]W THROAT  SUBJECTIVE: History from: patient.  Angie Boyle is a 29 y.o. female who presents with abrupt onset of sore throat for 2 days. Denies sick exposure to Covid, strep, flu or mono, or precipitating event. Has tried ibuprofen and tylenol without relief. Has negative history of Covid. Has not completed Covid vaccines. Symptoms are made worse with swallowing, but tolerating liquids and own secretions without difficulty. Denies previous symptoms in the past.     Denies fever, chills, fatigue, ear pain, sinus pain, rhinorrhea, nasal congestion, cough, SOB, wheezing, chest pain, nausea, rash, changes in bowel or bladder habits.     ROS: As per HPI.  All other pertinent ROS negative.     Past Medical History:  Diagnosis Date  . Anemia   . Fibroids   . History of kidney stones 2011  . Meningitis   . Migraines    Past Surgical History:  Procedure Laterality Date  . HYSTEROSCOPY WITH D & C N/A 06/02/2016   Procedure: DILATATION AND CURETTAGE /HYSTEROSCOPY;  Surgeon: Woodroe Mode, MD;  Location: [facility];  Service: Gynecology;  Laterality: N/A;  Clint Rep will be here   . ROBOTIC ASSISTED LAPAROSCOPIC VAGINAL HYSTERECTOMY WITH FIBROID REMOVAL    . spinal tap     Allergies  Allergen Reactions  . Vancomycin Other (See Comments)    Red man's syndrome  . Reglan [Metoclopramide] Anxiety   No current facility-administered medications on file prior to encounter.   Current Outpatient Medications on File Prior to Encounter  Medication Sig Dispense Refill  . drospirenone-ethinyl estradiol (YAZ,GIANVI,LORYNA) 3-0.02 MG tablet Take 1 tablet by mouth daily. 1 Package 11  . lidocaine (XYLOCAINE) 2 % solution Use as directed 15 mLs in the mouth or throat as needed for mouth pain (Do NOT swallow. (Swish and Spit)). 100 mL 0  . megestrol (MEGACE) 40 MG tablet Take 1 tablet (40 mg total) by mouth 2 (two) times daily. 60  tablet 2  . ondansetron (ZOFRAN ODT) 8 MG disintegrating tablet Take 1 tablet (8 mg total) by mouth every 8 (eight) hours as needed for nausea or vomiting. 10 tablet 0   Social History   Socioeconomic History  . Marital status: Single    Spouse name: Not on file  . Number of children: Not on file  . Years of education: Not on file  . Highest education level: Not on file  Occupational History  . Not on file  Tobacco Use  . Smoking status: Current Every Day Smoker    Packs/day: 0.10    Years: 4.00    Pack years: 0.40    Types: Cigarettes, Cigars  . Smokeless tobacco: Never Used  . Tobacco comment: Black and Mild  Vaping Use  . Vaping Use: Never used  Substance and Sexual Activity  . Alcohol use: Yes    Comment: Once a month  . Drug use: Yes    Types: Marijuana    Comment: daily  . Sexual activity: Yes    Birth control/protection: Condom  Other Topics Concern  . Not on file  Social History Narrative  . Not on file   Social Determinants of Health   Financial Resource Strain:   . Difficulty of Paying Living Expenses: Not on file  Food Insecurity:   . Worried About Charity fundraiser in the Last Year: Not on file  . Ran Out of Food in the Last Year: Not on  file  Transportation Needs:   . Film/video editor (Medical): Not on file  . Lack of Transportation (Non-Medical): Not on file  Physical Activity:   . Days of Exercise per Week: Not on file  . Minutes of Exercise per Session: Not on file  Stress:   . Feeling of Stress : Not on file  Social Connections:   . Frequency of Communication with Friends and Family: Not on file  . Frequency of Social Gatherings with Friends and Family: Not on file  . Attends Religious Services: Not on file  . Active Member of Clubs or Organizations: Not on file  . Attends Archivist Meetings: Not on file  . Marital Status: Not on file  Intimate Partner Violence:   . Fear of Current or Ex-Partner: Not on file  .  Emotionally Abused: Not on file  . Physically Abused: Not on file  . Sexually Abused: Not on file   Family History  Problem Relation Age of Onset  . Fibroids Mother   . Diabetes Maternal Grandmother   . Hypertension Maternal Grandmother     OBJECTIVE:  Vitals:   10/03/19 0828  BP: 117/75  Pulse: 87  Resp: 20  Temp: 98.8 F (37.1 C)  TempSrc: Oral  SpO2: 98%     General appearance: alert; appears fatigued, but nontoxic, speaking in full sentences and managing own secretions HEENT: NCAT; Ears: EACs clear, TMs pearly gray with visible cone of light, without erythema; Eyes: PERRL, EOMI grossly; Nose: no obvious rhinorrhea; Throat: oropharynx clear, tonsils 1+ and mildly erythematous without white tonsillar exudates, uvula midline Neck: supple without LAD Lungs: CTA bilaterally without adventitious breath sounds; cough absent Heart: regular rate and rhythm.  Radial pulses 2+ symmetrical bilaterally Skin: warm and dry Psychological: alert and cooperative; normal mood and affect  LABS: Results for orders placed or performed during the hospital encounter of 10/03/19 (from the past 24 hour(s))  POCT rapid strep A     Status: Abnormal   Collection Time: 10/03/19  8:43 AM  Result Value Ref Range   Rapid Strep A Screen Positive (A) Negative     ASSESSMENT & PLAN:  1. Streptococcal sore throat   2. Encounter for screening for COVID-19     Meds ordered this encounter  Medications  . DISCONTD: amoxicillin (AMOXIL) 500 MG tablet    Sig: Take 1 tablet (500 mg total) by mouth 2 (two) times daily for 10 days.    Dispense:  20 tablet    Refill:  0    Order Specific Question:   Supervising Provider    Answer:   Chase Picket [id number]  . fluconazole (DIFLUCAN) 200 MG tablet    Sig: Take 1 tablet (200 mg total) by mouth once for 1 dose.    Dispense:  2 tablet    Refill:  0    Order Specific Question:   Supervising Provider    Answer:   Chase Picket [id number]  .  amoxicillin (AMOXIL) 875 MG tablet    Sig: Take 1 tablet (875 mg total) by mouth 2 (two) times daily for 10 days.    Dispense:  20 tablet    Refill:  0    Order Specific Question:   Supervising Provider    Answer:   Chase Picket [id number]  . ibuprofen (ADVIL) 800 MG tablet    Sig: Take 1 tablet (800 mg total) by mouth every 8 (eight) hours as needed for moderate pain.  Dispense:  21 tablet    Refill:  0    Order Specific Question:   Supervising Provider    Answer:   Chase Picket [id number]    Strep was positive.  Push fluids and get rest Prescribed amoxicillin 875mg  twice daily for 10 days.   Patient has had recurrent strep Prescribed fluconazole in case of yeast Prescribed ibuprofen as needed ENT referral Take as directed and to completion.  Drink warm or cool liquids, use throat lozenges, or popsicles to help alleviate symptoms Take OTC ibuprofen or tylenol as needed for pain Follow up with PCP if symptoms persist Return or go to ER if you have any new or worsening symptoms such as fever, chills, nausea, vomiting, worsening sore throat, cough, abdominal pain, chest pain, changes in bowel or bladder habits  Reviewed expectations re: course of current medical issues. Questions answered. Outlined signs and symptoms indicating need for more acute intervention. Patient verbalized understanding. After Visit Summary given.          Faustino Congress, NP 10/03/19 [phone]

## 2019-10-05 LAB — NOVEL CORONAVIRUS, NAA: SARS-CoV-2, NAA: NOT DETECTED

## 2019-10-05 LAB — SARS-COV-2, NAA 2 DAY TAT

## 2019-10-28 ENCOUNTER — Ambulatory Visit (INDEPENDENT_AMBULATORY_CARE_PROVIDER_SITE_OTHER): Payer: 59 | Admitting: Otolaryngology

## 2019-10-28 ENCOUNTER — Other Ambulatory Visit: Payer: Self-pay

## 2019-10-28 DIAGNOSIS — J310 Chronic rhinitis: Secondary | ICD-10-CM | POA: Diagnosis not present

## 2019-10-28 DIAGNOSIS — J0301 Acute recurrent streptococcal tonsillitis: Secondary | ICD-10-CM

## 2019-10-28 NOTE — Progress Notes (Signed)
HPI: Angie Boyle is a 29 y.o. female who presents is referred by Faustino Congress, NP for evaluation of recurrent strep throat.  Patient states that she has had 3-4 strep throat so far this year that were cultured positive and treated with antibiotics.  She does have history of allergies with some popping in her ears.  She denies any sore throat today. She is otherwise healthy except for history of hypertension..  Past Medical History:  Diagnosis Date  . Anemia   . Fibroids   . History of kidney stones 2011  . Meningitis   . Migraines    Past Surgical History:  Procedure Laterality Date  . HYSTEROSCOPY WITH D & C N/A 06/02/2016   Procedure: DILATATION AND CURETTAGE /HYSTEROSCOPY;  Surgeon: Woodroe Mode, MD;  Location: [facility];  Service: Gynecology;  Laterality: N/A;  Clint Rep will be here   . ROBOTIC ASSISTED LAPAROSCOPIC VAGINAL HYSTERECTOMY WITH FIBROID REMOVAL    . spinal tap     Social History   Socioeconomic History  . Marital status: Single    Spouse name: Not on file  . Number of children: Not on file  . Years of education: Not on file  . Highest education level: Not on file  Occupational History  . Not on file  Tobacco Use  . Smoking status: Current Every Day Smoker    Packs/day: 0.10    Years: 4.00    Pack years: 0.40    Types: Cigarettes, Cigars  . Smokeless tobacco: Never Used  . Tobacco comment: Black and Mild  Vaping Use  . Vaping Use: Never used  Substance and Sexual Activity  . Alcohol use: Yes    Comment: Once a month  . Drug use: Yes    Types: Marijuana    Comment: daily  . Sexual activity: Yes    Birth control/protection: Condom  Other Topics Concern  . Not on file  Social History Narrative  . Not on file   Social Determinants of Health   Financial Resource Strain:   . Difficulty of Paying Living Expenses: Not on file  Food Insecurity:   . Worried About Charity fundraiser in the Last Year: Not on file  . Ran Out of Food in the Last  Year: Not on file  Transportation Needs:   . Lack of Transportation (Medical): Not on file  . Lack of Transportation (Non-Medical): Not on file  Physical Activity:   . Days of Exercise per Week: Not on file  . Minutes of Exercise per Session: Not on file  Stress:   . Feeling of Stress : Not on file  Social Connections:   . Frequency of Communication with Friends and Family: Not on file  . Frequency of Social Gatherings with Friends and Family: Not on file  . Attends Religious Services: Not on file  . Active Member of Clubs or Organizations: Not on file  . Attends Archivist Meetings: Not on file  . Marital Status: Not on file   Family History  Problem Relation Age of Onset  . Fibroids Mother   . Diabetes Maternal Grandmother   . Hypertension Maternal Grandmother    Allergies  Allergen Reactions  . Vancomycin Other (See Comments)    Red man's syndrome  . Reglan [Metoclopramide] Anxiety   Prior to Admission medications   Medication Sig Start Date End Date Taking? Authorizing Provider  drospirenone-ethinyl estradiol (YAZ,GIANVI,LORYNA) 3-0.02 MG tablet Take 1 tablet by mouth daily. 06/20/16  Woodroe Mode, MD  ibuprofen (ADVIL) 800 MG tablet Take 1 tablet (800 mg total) by mouth every 8 (eight) hours as needed for moderate pain. 10/03/19   Faustino Congress, NP  lidocaine (XYLOCAINE) 2 % solution Use as directed 15 mLs in the mouth or throat as needed for mouth pain (Do NOT swallow. (Swish and Spit)). 04/29/18   Deliah Boston, PA-C  megestrol (MEGACE) 40 MG tablet Take 1 tablet (40 mg total) by mouth 2 (two) times daily. 03/23/16   Woodroe Mode, MD  ondansetron (ZOFRAN ODT) 8 MG disintegrating tablet Take 1 tablet (8 mg total) by mouth every 8 (eight) hours as needed for nausea or vomiting. 08/06/17   Molpus, Jenny Reichmann, MD     Positive ROS: Otherwise negative  All other systems have been reviewed and were otherwise negative with the exception of those mentioned in the  HPI and as above.  Physical Exam: Constitutional: Alert, well-appearing, no acute distress Ears: External ears without lesions or tenderness. Ear canals are clear bilaterally with intact, clear TMs bilaterally. Nasal: External nose without lesions. Septum with minimal deformity and moderate rhinitis.  After decongesting the nose both middle meatus regions were clear with no signs of infection..  Oral: Lips and gums without lesions. Tongue and palate mucosa without lesions. Posterior oropharynx clear.  Tonsils are 2+ average size bilaterally with no acute exudate. Neck: No palpable adenopathy or masses Respiratory: Breathing comfortably  Skin: No facial/neck lesions or rash noted.  Procedures  Assessment: History of recurrent tonsillitis and strep throat. Chronic rhinitis with eustachian tube dysfunction.  Plan: Reviewed with patient concerning treatment options for recurrent strep which are limited.  Briefly discussed the possibility of tonsillectomy.  Also reviewed with her the morbidity associated with tonsillectomy and that she will have to be out of work for 10 days if she had her tonsils removed. Recommended use of Nasacort 2 sprays each nostril at night and prescribed at this to help with nasal congestion and eustachian tube dysfunction or the popping in her ears. She is not ready to have her tonsils removed presently but if she has recurrent problems she will call us back to schedule surgery.   Radene Journey, MD   CC:

## 2023-02-15 ENCOUNTER — Ambulatory Visit
Admission: EM | Admit: 2023-02-15 | Discharge: 2023-02-15 | Disposition: A | Discharge status: Home / Self Care | Payer: BC Managed Care – PPO

## 2023-02-15 DIAGNOSIS — J36 Peritonsillar abscess: Secondary | ICD-10-CM | POA: Diagnosis not present

## 2023-02-15 NOTE — ED Triage Notes (Signed)
Pt c/o sore throat x2 days. L side of neck is visibly swollen. Voice muffled. Reports difficulty swallowing and intermittently with breathing. States she feels like "something is in there." Upon assessment, throat is swollen and red.

## 2023-02-15 NOTE — Discharge Instructions (Signed)
Given your evaluation today and risk factors, I recommend you go directly to the ER for further imaging and evaluation.  Please noted our office is limited in the tests and imaging we can perform at our facility. Your evaluation was indicates a higher level of care is needed at this time.

## 2023-02-15 NOTE — ED Provider Notes (Signed)
[facility]  Note:  This document was prepared using Dragon voice recognition software and may include unintentional dictation errors.  MRN: [id number] DOB: 05-06-1990 DATE: 02/15/23   Subjective:  Chief Complaint:  Chief Complaint  Patient presents with   Sore Throat     HPI: Angie Boyle is a 33 y.o. female presenting for sore throat for 2 days. Reports sudden worsening. She states she has started having trouble swallowing and talking. No similar issues in the past. Denies fever, nausea/vomiting. Endorses sore throat, trismus, muffled voice. Presents NAD.  Prior to Admission medications   Medication Sig Start Date End Date Taking? Authorizing Provider  drospirenone-ethinyl estradiol (YAZ,GIANVI,LORYNA) 3-0.02 MG tablet Take 1 tablet by mouth daily. 06/20/16   Adam Phenix, MD  ibuprofen (ADVIL) 800 MG tablet Take 1 tablet (800 mg total) by mouth every 8 (eight) hours as needed for moderate pain. 10/03/19   Moshe Cipro, FNP  lidocaine (XYLOCAINE) 2 % solution Use as directed 15 mLs in the mouth or throat as needed for mouth pain (Do NOT swallow. (Swish and Spit)). 04/29/18   Bill Salinas, PA-C  megestrol (MEGACE) 40 MG tablet Take 1 tablet (40 mg total) by mouth 2 (two) times daily. 03/23/16   Adam Phenix, MD  ondansetron (ZOFRAN ODT) 8 MG disintegrating tablet Take 1 tablet (8 mg total) by mouth every 8 (eight) hours as needed for nausea or vomiting. 08/06/17   Molpus, Jonny Ruiz, MD     Allergies  Allergen Reactions   Vancomycin Other (See Comments)    Red man's syndrome   Reglan [Metoclopramide] Anxiety    History:   Past Medical History:  Diagnosis Date   Anemia    Fibroids    History of kidney stones 2011   Meningitis    Migraines      Past Surgical History:  Procedure Laterality Date   HYSTEROSCOPY WITH D & C N/A 06/02/2016   Procedure: DILATATION AND CURETTAGE /HYSTEROSCOPY;  Surgeon: Adam Phenix, MD;  Location: [facility];  Service:  Gynecology;  Laterality: N/A;  Clint Rep will be here    ROBOTIC ASSISTED LAPAROSCOPIC VAGINAL HYSTERECTOMY WITH FIBROID REMOVAL     spinal tap      Family History  Problem Relation Age of Onset   Fibroids Mother    Diabetes Maternal Grandmother    Hypertension Maternal Grandmother     Social History   Tobacco Use   Smoking status: Every Day    Current packs/day: 0.10    Average packs/day: 0.1 packs/day for 4.0 years (0.4 ttl pk-yrs)    Types: Cigarettes, Cigars   Smokeless tobacco: Never   Tobacco comments:    Black and Mild  Vaping Use   Vaping status: Never Used  Substance Use Topics   Alcohol use: Yes    Comment: Once a month   Drug use: Yes    Types: Marijuana    Comment: daily    Review of Systems  Constitutional:  Negative for fever.  HENT:  Positive for sore throat, trouble swallowing and voice change.   Gastrointestinal:  Negative for nausea and vomiting.     Objective:   Vitals: BP (!) 152/90 (BP Location: Right Arm)   Pulse 90   Temp 98.7 F (37.1 C) (Oral)   Resp 20   SpO2 97%   Physical Exam Constitutional:      General: She is not in acute distress.    Appearance: Normal appearance. She is well-developed. She is obese.  She is not ill-appearing or toxic-appearing.  HENT:     Head: Normocephalic and atraumatic.     Mouth/Throat:     Pharynx: Pharyngeal swelling and posterior oropharyngeal erythema present.     Tonsils: Tonsillar abscess present.     Comments: Hot potato voice and trismus noted on exam. Left sided tonsillar abscess with uvula shifted to the right.  Cardiovascular:     Rate and Rhythm: Normal rate and regular rhythm.  Pulmonary:     Effort: Pulmonary effort is normal.     Breath sounds: Normal breath sounds.  Abdominal:     General: Bowel sounds are normal.     Palpations: Abdomen is soft.     Tenderness: There is no abdominal tenderness.  Lymphadenopathy:     Cervical: Cervical adenopathy present.     Left cervical:  Superficial cervical adenopathy present.  Skin:    General: Skin is warm and dry.  Neurological:     General: No focal deficit present.     Mental Status: She is alert.  Psychiatric:        Mood and Affect: Mood and affect normal.     Results:  Labs: No results found for this or any previous visit (from the past 24 hours).  Radiology: No results found.   UC Course/Treatments:  Procedures: Procedures   Medications Ordered in UC: Medications - No data to display   Assessment and Plan :     ICD-10-CM   1. Peritonsillar abscess  J36      Peritonsillar abscess Patient presented to urgent care complaining of sore throat.   On exam, patient was noted to have tonsillar abscess with uvula shift to the right and trismus with hot potato voice.  Given peritonsillar abscess, it is recommend that patient go directly to the ER for further evaluation/treatment/observation. Concern for possible airway obstruction.   Patient is stable for discharge to the emergency department. she will be going to the ER via private car driven by herself.  ED Discharge Orders     None        PDMP not reviewed this encounter.     Jann Milkovich, Annabell Howells, PA-C 02/15/23 1149

## 2023-02-15 NOTE — ED Notes (Signed)
Patient is being discharged from the Urgent Care and sent to the Emergency Department via POV . Per A. Hermanns, PA-C, patient is in need of higher level of care due to concern for peritonsillar abscess. Patient is aware and verbalizes understanding of plan of care.  Vitals:   02/15/23 1135  BP: (!) 152/90  Pulse: 90  Resp: 20  Temp: 98.7 F (37.1 C)  SpO2: 97%
